# Patient Record
Sex: Female | Born: 1950 | Race: White | Hispanic: No | Marital: Married | Smoking: Never smoker
Health system: Southern US, Community
[De-identification: ages and names within clinical notes are randomized; demographics above are authoritative.]

## PROBLEM LIST (undated history)

## (undated) DIAGNOSIS — F419 Anxiety disorder, unspecified: Secondary | ICD-10-CM

## (undated) DIAGNOSIS — G4733 Obstructive sleep apnea (adult) (pediatric): Secondary | ICD-10-CM

## (undated) DIAGNOSIS — K219 Gastro-esophageal reflux disease without esophagitis: Secondary | ICD-10-CM

## (undated) DIAGNOSIS — I499 Cardiac arrhythmia, unspecified: Secondary | ICD-10-CM

## (undated) DIAGNOSIS — T300 Burn of unspecified body region, unspecified degree: Secondary | ICD-10-CM

## (undated) HISTORY — DX: Burn of unspecified body region, unspecified degree: T30.0

## (undated) HISTORY — DX: Obstructive sleep apnea (adult) (pediatric): G47.33

## (undated) HISTORY — PX: TUBAL LIGATION: SHX77

## (undated) HISTORY — PX: CYSTOSCOPY: SUR368

## (undated) HISTORY — DX: Anxiety disorder, unspecified: F41.9

## (undated) HISTORY — PX: COLONOSCOPY: SHX174

---

## 1988-09-21 DIAGNOSIS — T300 Burn of unspecified body region, unspecified degree: Secondary | ICD-10-CM

## 1988-09-21 HISTORY — DX: Burn of unspecified body region, unspecified degree: T30.0

## 1999-11-17 ENCOUNTER — Other Ambulatory Visit: Admission: RE | Admit: 1999-11-17 | Discharge: 1999-11-17 | Payer: Self-pay | Admitting: Gynecology

## 2000-04-13 ENCOUNTER — Encounter: Payer: Self-pay | Admitting: Gynecology

## 2000-04-13 ENCOUNTER — Encounter: Admission: RE | Admit: 2000-04-13 | Discharge: 2000-04-13 | Payer: Self-pay | Admitting: Gynecology

## 2000-10-02 ENCOUNTER — Ambulatory Visit (HOSPITAL_BASED_OUTPATIENT_CLINIC_OR_DEPARTMENT_OTHER): Admission: RE | Admit: 2000-10-02 | Discharge: 2000-10-02 | Payer: Self-pay | Admitting: Internal Medicine

## 2000-11-18 ENCOUNTER — Other Ambulatory Visit: Admission: RE | Admit: 2000-11-18 | Discharge: 2000-11-18 | Payer: Self-pay | Admitting: Gynecology

## 2002-03-02 LAB — HM COLONOSCOPY: HM Colonoscopy: NORMAL

## 2004-12-02 ENCOUNTER — Ambulatory Visit: Payer: Self-pay | Admitting: Internal Medicine

## 2005-04-27 ENCOUNTER — Ambulatory Visit: Payer: Self-pay | Admitting: Internal Medicine

## 2005-09-23 ENCOUNTER — Other Ambulatory Visit: Admission: RE | Admit: 2005-09-23 | Discharge: 2005-09-23 | Payer: Self-pay | Admitting: Gynecology

## 2005-09-24 ENCOUNTER — Ambulatory Visit: Payer: Self-pay | Admitting: Internal Medicine

## 2005-11-19 ENCOUNTER — Ambulatory Visit: Payer: Self-pay | Admitting: Internal Medicine

## 2005-12-07 ENCOUNTER — Ambulatory Visit: Payer: Self-pay | Admitting: Internal Medicine

## 2006-04-27 ENCOUNTER — Ambulatory Visit: Payer: Self-pay | Admitting: Internal Medicine

## 2006-08-26 ENCOUNTER — Ambulatory Visit: Payer: Self-pay | Admitting: Internal Medicine

## 2006-08-31 ENCOUNTER — Ambulatory Visit: Payer: Self-pay | Admitting: Internal Medicine

## 2006-09-04 ENCOUNTER — Ambulatory Visit: Payer: Self-pay | Admitting: Family Medicine

## 2006-10-01 ENCOUNTER — Ambulatory Visit: Payer: Self-pay | Admitting: Internal Medicine

## 2006-10-26 ENCOUNTER — Ambulatory Visit: Payer: Self-pay | Admitting: Internal Medicine

## 2006-11-30 ENCOUNTER — Ambulatory Visit: Payer: Self-pay | Admitting: Internal Medicine

## 2007-01-24 ENCOUNTER — Other Ambulatory Visit: Admission: RE | Admit: 2007-01-24 | Discharge: 2007-01-24 | Payer: Self-pay | Admitting: Internal Medicine

## 2007-01-24 ENCOUNTER — Encounter: Payer: Self-pay | Admitting: Internal Medicine

## 2007-01-24 ENCOUNTER — Ambulatory Visit: Payer: Self-pay | Admitting: Internal Medicine

## 2007-01-24 LAB — CONVERTED CEMR LAB

## 2007-04-15 ENCOUNTER — Encounter: Payer: Self-pay | Admitting: Internal Medicine

## 2007-04-15 DIAGNOSIS — T300 Burn of unspecified body region, unspecified degree: Secondary | ICD-10-CM | POA: Insufficient documentation

## 2007-04-15 DIAGNOSIS — J158 Pneumonia due to other specified bacteria: Secondary | ICD-10-CM | POA: Insufficient documentation

## 2007-04-15 DIAGNOSIS — R0683 Snoring: Secondary | ICD-10-CM | POA: Insufficient documentation

## 2007-07-14 ENCOUNTER — Encounter: Payer: Self-pay | Admitting: Internal Medicine

## 2007-07-14 ENCOUNTER — Ambulatory Visit: Payer: Self-pay | Admitting: Internal Medicine

## 2007-07-14 DIAGNOSIS — F439 Reaction to severe stress, unspecified: Secondary | ICD-10-CM | POA: Insufficient documentation

## 2007-10-25 ENCOUNTER — Encounter: Payer: Self-pay | Admitting: Internal Medicine

## 2007-11-10 ENCOUNTER — Encounter: Payer: Self-pay | Admitting: Internal Medicine

## 2007-12-28 ENCOUNTER — Ambulatory Visit: Payer: Self-pay | Admitting: Internal Medicine

## 2007-12-28 ENCOUNTER — Telehealth: Payer: Self-pay | Admitting: Internal Medicine

## 2008-02-02 ENCOUNTER — Telehealth: Payer: Self-pay | Admitting: Internal Medicine

## 2008-04-09 ENCOUNTER — Ambulatory Visit: Payer: Self-pay | Admitting: Internal Medicine

## 2008-04-09 DIAGNOSIS — IMO0002 Reserved for concepts with insufficient information to code with codable children: Secondary | ICD-10-CM | POA: Insufficient documentation

## 2008-05-01 ENCOUNTER — Ambulatory Visit: Payer: Self-pay | Admitting: Internal Medicine

## 2008-05-01 LAB — CONVERTED CEMR LAB
ALT: 21 units/L (ref 0–35)
AST: 26 units/L (ref 0–37)
Albumin: 3.9 g/dL (ref 3.5–5.2)
Alkaline Phosphatase: 79 units/L (ref 39–117)
BUN: 16 mg/dL (ref 6–23)
Basophils Absolute: 0.1 10*3/uL (ref 0.0–0.1)
Basophils Relative: 0.9 % (ref 0.0–3.0)
Bilirubin Urine: NEGATIVE
Bilirubin, Direct: 0.1 mg/dL (ref 0.0–0.3)
CO2: 30 meq/L (ref 19–32)
Calcium: 9.3 mg/dL (ref 8.4–10.5)
Chloride: 101 meq/L (ref 96–112)
Cholesterol: 158 mg/dL (ref 0–200)
Creatinine, Ser: 0.8 mg/dL (ref 0.4–1.2)
Eosinophils Absolute: 0.3 10*3/uL (ref 0.0–0.7)
Eosinophils Relative: 3 % (ref 0.0–5.0)
GFR calc Af Amer: 95 mL/min
GFR calc non Af Amer: 79 mL/min
Glucose, Bld: 99 mg/dL (ref 70–99)
HCT: 37.6 % (ref 36.0–46.0)
HDL: 44.7 mg/dL (ref >39.0)
Hemoglobin, Urine: NEGATIVE
Hemoglobin: 13.3 g/dL (ref 12.0–15.0)
Ketones, ur: NEGATIVE mg/dL
LDL Cholesterol: 83 mg/dL (ref 0–99)
Leukocytes, UA: NEGATIVE
Lymphocytes Relative: 41.4 % (ref 12.0–46.0)
MCHC: 35.4 g/dL (ref 30.0–36.0)
MCV: 89.6 fL (ref 78.0–100.0)
Monocytes Absolute: 0.5 10*3/uL (ref 0.1–1.0)
Monocytes Relative: 5.4 % (ref 3.0–12.0)
Neutro Abs: 4.1 10*3/uL (ref 1.4–7.7)
Neutrophils Relative %: 49.3 % (ref 43.0–77.0)
Nitrite: NEGATIVE
Platelets: 210 10*3/uL (ref 150–400)
Potassium: 4.8 meq/L (ref 3.5–5.1)
RBC: 4.2 M/uL (ref 3.87–5.11)
RDW: 11.9 % (ref 11.5–14.6)
Sodium: 137 meq/L (ref 135–145)
Specific Gravity, Urine: 1.02 (ref 1.000–1.03)
TSH: 1.49 microintl units/mL (ref 0.35–5.50)
Total Bilirubin: 1.2 mg/dL (ref 0.3–1.2)
Total CHOL/HDL Ratio: 3.5
Total Protein, Urine: NEGATIVE mg/dL
Total Protein: 6.6 g/dL (ref 6.0–8.3)
Triglycerides: 150 mg/dL — ABNORMAL HIGH (ref 0–149)
Urine Glucose: NEGATIVE mg/dL
Urobilinogen, UA: 0.2 (ref 0.0–1.0)
VLDL: 30 mg/dL (ref 0–40)
WBC: 8.5 10*3/uL (ref 4.5–10.5)
pH: 6 (ref 5.0–8.0)

## 2008-05-09 ENCOUNTER — Encounter: Payer: Self-pay | Admitting: Internal Medicine

## 2008-05-09 ENCOUNTER — Ambulatory Visit: Payer: Self-pay | Admitting: Internal Medicine

## 2008-05-09 ENCOUNTER — Other Ambulatory Visit: Admission: RE | Admit: 2008-05-09 | Discharge: 2008-05-09 | Payer: Self-pay | Admitting: Internal Medicine

## 2008-05-09 DIAGNOSIS — N952 Postmenopausal atrophic vaginitis: Secondary | ICD-10-CM | POA: Insufficient documentation

## 2008-05-15 ENCOUNTER — Encounter: Payer: Self-pay | Admitting: Internal Medicine

## 2008-07-05 ENCOUNTER — Ambulatory Visit: Payer: Self-pay | Admitting: Internal Medicine

## 2008-07-05 DIAGNOSIS — J019 Acute sinusitis, unspecified: Secondary | ICD-10-CM | POA: Insufficient documentation

## 2008-07-05 DIAGNOSIS — J3089 Other allergic rhinitis: Secondary | ICD-10-CM

## 2008-07-05 DIAGNOSIS — J302 Other seasonal allergic rhinitis: Secondary | ICD-10-CM | POA: Insufficient documentation

## 2008-11-13 ENCOUNTER — Encounter: Payer: Self-pay | Admitting: Internal Medicine

## 2008-12-03 ENCOUNTER — Ambulatory Visit: Payer: Self-pay | Admitting: Internal Medicine

## 2008-12-03 DIAGNOSIS — H8309 Labyrinthitis, unspecified ear: Secondary | ICD-10-CM | POA: Insufficient documentation

## 2008-12-24 ENCOUNTER — Telehealth: Payer: Self-pay | Admitting: Internal Medicine

## 2009-01-05 ENCOUNTER — Ambulatory Visit: Payer: Self-pay | Admitting: Family Medicine

## 2009-06-10 ENCOUNTER — Ambulatory Visit: Payer: Self-pay | Admitting: Internal Medicine

## 2009-06-10 LAB — CONVERTED CEMR LAB
ALT: 19 units/L (ref 0–35)
AST: 23 units/L (ref 0–37)
Albumin: 3.8 g/dL (ref 3.5–5.2)
Alkaline Phosphatase: 71 units/L (ref 39–117)
BUN: 13 mg/dL (ref 6–23)
Basophils Absolute: 0.1 10*3/uL (ref 0.0–0.1)
Basophils Relative: 0.6 % (ref 0.0–3.0)
Bilirubin Urine: NEGATIVE
Bilirubin, Direct: 0.1 mg/dL (ref 0.0–0.3)
CO2: 29 meq/L (ref 19–32)
Calcium: 8.9 mg/dL (ref 8.4–10.5)
Chloride: 104 meq/L (ref 96–112)
Cholesterol: 174 mg/dL (ref 0–200)
Creatinine, Ser: 0.8 mg/dL (ref 0.4–1.2)
Eosinophils Absolute: 0.3 10*3/uL (ref 0.0–0.7)
Eosinophils Relative: 4 % (ref 0.0–5.0)
GFR calc non Af Amer: 78.49 mL/min (ref >60)
Glucose, Bld: 88 mg/dL (ref 70–99)
HCT: 36.3 % (ref 36.0–46.0)
HDL: 44.7 mg/dL (ref >39.00)
Hemoglobin, Urine: NEGATIVE
Hemoglobin: 12.7 g/dL (ref 12.0–15.0)
Ketones, ur: NEGATIVE mg/dL
LDL Cholesterol: 104 mg/dL — ABNORMAL HIGH (ref 0–99)
Leukocytes, UA: NEGATIVE
Lymphocytes Relative: 44 % (ref 12.0–46.0)
Lymphs Abs: 3.7 10*3/uL (ref 0.7–4.0)
MCHC: 34.9 g/dL (ref 30.0–36.0)
MCV: 89.3 fL (ref 78.0–100.0)
Monocytes Absolute: 0.5 10*3/uL (ref 0.1–1.0)
Monocytes Relative: 5.7 % (ref 3.0–12.0)
Neutro Abs: 3.9 10*3/uL (ref 1.4–7.7)
Neutrophils Relative %: 45.7 % (ref 43.0–77.0)
Nitrite: NEGATIVE
Platelets: 211 10*3/uL (ref 150.0–400.0)
Potassium: 4.8 meq/L (ref 3.5–5.1)
RBC: 4.07 M/uL (ref 3.87–5.11)
RDW: 12.3 % (ref 11.5–14.6)
Sodium: 139 meq/L (ref 135–145)
Specific Gravity, Urine: 1.015 (ref 1.000–1.030)
TSH: 1.21 microintl units/mL (ref 0.35–5.50)
Total Bilirubin: 1.1 mg/dL (ref 0.3–1.2)
Total CHOL/HDL Ratio: 4
Total Protein, Urine: NEGATIVE mg/dL
Total Protein: 6.6 g/dL (ref 6.0–8.3)
Triglycerides: 128 mg/dL (ref 0.0–149.0)
Urine Glucose: NEGATIVE mg/dL
Urobilinogen, UA: 0.2 (ref 0.0–1.0)
VLDL: 25.6 mg/dL (ref 0.0–40.0)
WBC: 8.5 10*3/uL (ref 4.5–10.5)
pH: 7 (ref 5.0–8.0)

## 2009-06-13 ENCOUNTER — Ambulatory Visit: Payer: Self-pay | Admitting: Internal Medicine

## 2009-06-13 ENCOUNTER — Encounter: Payer: Self-pay | Admitting: Internal Medicine

## 2009-06-13 ENCOUNTER — Other Ambulatory Visit: Admission: RE | Admit: 2009-06-13 | Discharge: 2009-06-13 | Payer: Self-pay | Admitting: Internal Medicine

## 2009-06-19 ENCOUNTER — Encounter: Payer: Self-pay | Admitting: Internal Medicine

## 2009-09-03 ENCOUNTER — Ambulatory Visit: Payer: Self-pay | Admitting: Internal Medicine

## 2009-11-19 ENCOUNTER — Encounter: Payer: Self-pay | Admitting: Internal Medicine

## 2009-11-19 LAB — HM MAMMOGRAPHY: HM Mammogram: NORMAL

## 2009-11-25 ENCOUNTER — Telehealth: Payer: Self-pay | Admitting: Internal Medicine

## 2009-12-30 ENCOUNTER — Ambulatory Visit: Payer: Self-pay | Admitting: Internal Medicine

## 2010-07-22 ENCOUNTER — Ambulatory Visit: Payer: Self-pay | Admitting: Internal Medicine

## 2010-07-22 LAB — CONVERTED CEMR LAB
ALT: 31 units/L (ref 0–35)
AST: 37 units/L (ref 0–37)
Albumin: 3.9 g/dL (ref 3.5–5.2)
Alkaline Phosphatase: 90 units/L (ref 39–117)
BUN: 16 mg/dL (ref 6–23)
Basophils Absolute: 0 10*3/uL (ref 0.0–0.1)
Basophils Relative: 0.6 % (ref 0.0–3.0)
Bilirubin Urine: NEGATIVE
Bilirubin, Direct: 0.2 mg/dL (ref 0.0–0.3)
CO2: 32 meq/L (ref 19–32)
Calcium: 9.7 mg/dL (ref 8.4–10.5)
Chloride: 98 meq/L (ref 96–112)
Cholesterol: 205 mg/dL — ABNORMAL HIGH (ref 0–200)
Creatinine, Ser: 0.7 mg/dL (ref 0.4–1.2)
Direct LDL: 120.1 mg/dL
Eosinophils Absolute: 0.3 10*3/uL (ref 0.0–0.7)
Eosinophils Relative: 4.3 % (ref 0.0–5.0)
GFR calc non Af Amer: 88.29 mL/min (ref >60)
Glucose, Bld: 78 mg/dL (ref 70–99)
HCT: 41.5 % (ref 36.0–46.0)
HDL: 53.8 mg/dL (ref >39.00)
Hemoglobin, Urine: NEGATIVE
Hemoglobin: 14.1 g/dL (ref 12.0–15.0)
Ketones, ur: NEGATIVE mg/dL
Leukocytes, UA: NEGATIVE
Lymphocytes Relative: 42.1 % (ref 12.0–46.0)
Lymphs Abs: 3.3 10*3/uL (ref 0.7–4.0)
MCHC: 34.1 g/dL (ref 30.0–36.0)
MCV: 91.7 fL (ref 78.0–100.0)
Monocytes Absolute: 0.5 10*3/uL (ref 0.1–1.0)
Monocytes Relative: 5.9 % (ref 3.0–12.0)
Neutro Abs: 3.7 10*3/uL (ref 1.4–7.7)
Neutrophils Relative %: 47.1 % (ref 43.0–77.0)
Nitrite: NEGATIVE
Platelets: 240 10*3/uL (ref 150.0–400.0)
Potassium: 4.9 meq/L (ref 3.5–5.1)
RBC: 4.53 M/uL (ref 3.87–5.11)
RDW: 12.8 % (ref 11.5–14.6)
Sodium: 137 meq/L (ref 135–145)
Specific Gravity, Urine: <=1.005 (ref 1.000–1.030)
TSH: 2.68 microintl units/mL (ref 0.35–5.50)
Total Bilirubin: 1.1 mg/dL (ref 0.3–1.2)
Total CHOL/HDL Ratio: 4
Total Protein, Urine: NEGATIVE mg/dL
Total Protein: 7.1 g/dL (ref 6.0–8.3)
Triglycerides: 106 mg/dL (ref 0.0–149.0)
Urine Glucose: NEGATIVE mg/dL
Urobilinogen, UA: 0.2 (ref 0.0–1.0)
VLDL: 21.2 mg/dL (ref 0.0–40.0)
WBC: 7.9 10*3/uL (ref 4.5–10.5)
pH: 7 (ref 5.0–8.0)

## 2010-07-29 ENCOUNTER — Ambulatory Visit: Payer: Self-pay | Admitting: Internal Medicine
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
## 2010-10-21 NOTE — Progress Notes (Signed)
        Preventive Care Screening  Mammogram:    Date:  11/19/2009    Results:  normal

## 2010-11-19 ENCOUNTER — Telehealth: Payer: Self-pay | Admitting: Internal Medicine

## 2010-11-25 ENCOUNTER — Encounter: Payer: Self-pay | Admitting: Internal Medicine

## 2010-11-27 NOTE — Progress Notes (Signed)
Summary: pt requests azithromycin liquid be called in  Phone Note Call from Patient Call back at Work Phone [phone] Call back at  or pm call [phone]   Caller: Patient Call For: Dr Debby Bud Summary of Call: PT has headache, face pain, around sinus area. Pt last ov Nov 2011. We have no openings in the clinic today, can she get antibiotic, [facility]. Initial call taken by: Verdell Face,  November 19, 2010 8:10 AM  Follow-up for Phone Call        ok for azithromycin 200/ml, 2.5 ml once daily x 3 days, 10cc Follow-up by: Jacques Navy MD,  November 19, 2010 1:25 PM  Additional Follow-up for Phone Call Additional follow up Details #1::        Patient notified.Alvy Beal Archie CMA  November 19, 2010 4:22 PM     New/Updated Medications: AZITHROMYCIN 200 MG/5ML SUSR (AZITHROMYCIN) 2.18ml daily x 3days Prescriptions: AZITHROMYCIN 200 MG/5ML SUSR (AZITHROMYCIN) 2.7ml daily x 3days  #10cc x 0   Entered by:   Rock Nephew CMA   Authorized by:   Jacques Navy MD   Signed by:   Rock Nephew CMA on 11/19/2010   Method used:   Faxed to ...       [facility]  [address] # [id number]* (retail)       [address]       [place], [place]  [address]       Ph: [phone] or [phone]       Fax: [phone]   RxID:   [id number]

## 2011-01-27 ENCOUNTER — Other Ambulatory Visit: Payer: Self-pay | Admitting: Internal Medicine

## 2011-01-28 ENCOUNTER — Telehealth: Payer: Self-pay | Admitting: *Deleted

## 2011-01-28 NOTE — Telephone Encounter (Signed)
Ok for refill x 5 

## 2011-01-28 NOTE — Telephone Encounter (Signed)
Please Advise refill on Alprazolam 0.5 mg.

## 2011-01-29 ENCOUNTER — Ambulatory Visit (INDEPENDENT_AMBULATORY_CARE_PROVIDER_SITE_OTHER): Payer: BC Managed Care – PPO | Admitting: Internal Medicine

## 2011-01-29 VITALS — BP 100/62 | HR 78 | Temp 97.8°F | Wt 129.0 lb

## 2011-01-29 DIAGNOSIS — F411 Generalized anxiety disorder: Secondary | ICD-10-CM

## 2011-01-29 MED ORDER — ALPRAZOLAM 0.5 MG PO TABS: 0.2500 mg | ORAL_TABLET | Freq: Three times a day (TID) | ORAL | Status: DC | PRN

## 2011-01-30 ENCOUNTER — Telehealth: Payer: Self-pay | Admitting: *Deleted

## 2011-01-30 MED ORDER — ALPRAZOLAM 0.5 MG PO TABS: 0.2500 mg | ORAL_TABLET | Freq: Three times a day (TID) | ORAL | Status: DC | PRN

## 2011-01-30 NOTE — Telephone Encounter (Signed)
Recived another mess- Alprazolam was called into [facility] (which she had to pay out of pocket for b/c ins did not cover at that pharm) Per pt, they gave her the "round" pills which cause her to be "sick". Need to call pharm for more details. Is it ok to provide new rx for alprazolam to pt? She fill rx yesterday but says she can not take these.

## 2011-01-30 NOTE — Telephone Encounter (Signed)
Pt aware that Rx was called in. She says she needs oval NOT round manuf - confirmed w/pharm, they have oval tabs.

## 2011-01-30 NOTE — Telephone Encounter (Signed)
Pt left mess w/front desk, req a call back.

## 2011-01-30 NOTE — Telephone Encounter (Signed)
She needs brand name and it is ok for another prescription.

## 2011-02-01 NOTE — Progress Notes (Signed)
  Subjective:    Patient ID: Amber Shelton, female    DOB: 04-17-1951, 60 y.o.   MRN: [id number]  HPI Amber Shelton presents for routine medical follow-up and attention to her chronic anxiety. In the interval since her last visit she has been doing well with no major medical problems. 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 She has the usual stresses of work in addition to the responsibilty for her parents. She has no specific complaints.   PMH, FamHx and SocHx reviewed for any changes and relevance.    Review of Systems Review of Systems  Constitutional:  Negative for fever, chills, activity change and unexpected weight change.  HENT:  Negative for hearing loss, ear pain, congestion, neck stiffness and postnasal drip.   Eyes: Negative for pain, discharge and visual disturbance.  Respiratory: Negative for chest tightness and wheezing.   Cardiovascular: Negative for chest pain and palpitations.       [No decreased exercise tolerance Gastrointestinal: [No change in bowel habit. No bloating or gas. No reflux or indigestion Genitourinary: Negative for urgency, frequency, flank pain and difficulty urinating.  Musculoskeletal: Negative for myalgias, back pain, arthralgias and gait problem.  Neurological: Negative for dizziness, tremors, weakness and headaches.  Hematological: Negative for adenopathy.  Psychiatric/Behavioral: Negative for behavioral problems and dysphoric mood.       Objective:   Physical Exam WNWD white woman in no distress HEENT- C&S clear, oropharynx without lesions. Chest - good breath sounds Cor - RRR Abdomen - soft.       Assessment & Plan:  1. Chrnic anxiety - she is doing well and remains controlled on her current medical regimen.  Plan- renewed Rx x 6 months.
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
## 2011-02-06 NOTE — Assessment & Plan Note (Signed)
[facility]                           PRIMARY CARE OFFICE NOTE   Amber, Shelton                       MRN:          [id number]  DATE:08/26/2006                            DOB:          05/14/1951    Amber Shelton is well-known to me and followed on a regular basis.  She was  seen acutely today, without benefit of her chart.   The patient reports she has been fatigued throughout November.  She says  this coincides with when they turned on the heat in their new home.  In  fact, she reports that, since moving into her new home, she has had more  problems with her allergies and congestion than she has ever had.  The  patient reports Tuesday, December 4, she developed a sore throat for 24  hours, which progressed to laryngitis, which has now progressed to  significant sinus congestion with cough, postnasal drainage.  She has  had no purulent drainage.   CURRENT MEDICATIONS:  1. Xanax daily.  2. Claritin 10 mg daily.  3. Vitamins.   REVIEW OF SYSTEMS:  Negative for any cardiovascular symptoms.  She has  had no respiratory difficulty nor wheezing.  No GI complaints.   PHYSICAL EXAMINATION:  VITAL SIGNS:  Temperature 98.8, blood pressure  120/79, pulse 83, weight 130.  GENERAL APPEARANCE:  A well-nourished woman in no acute distress.  HEENT EXAM:  The patient had no tenderness to percussion over her  frontal or maxillary sinuses.  EACs and TMs were normal.  CHEST:  Clear with no rales, wheezes or rhonchi.   IMPRESSION AND PLAN:  Allergic rhinitis, exacerbated by probable heating  duct contaminants.   PLAN:  The patient is to continue with nasal saline wash.  I have  recommended using aromatics and vaporizer treatments.  She was given  samples of Veramyst and she is to continue with fluticasone if this is  beneficial to her.  She will continue with Claritin.     Rosalyn Gess Norins, MD  Electronically Signed    MEN/MedQ  DD: 08/27/2006  DT:  08/27/2006  Job #: [id number]

## 2011-02-06 NOTE — Assessment & Plan Note (Signed)
[facility]                             PULMONARY OFFICE NOTE   KASH, MOTHERSHEAD                       MRN:          [id number]  DATE:10/26/2006                            DOB:          Jun 30, 1951    PULMONARY/ALLERGY FOLLOWUP   PRIMARY PHYSICIAN:  Dr. Illene Regulus.   PROBLEM:  Seasonal rhinitis.   HISTORY:  She returns today as planned for allergy skin testing, saying  that she feels well currently, no recent viral infections.  She never  goes anywhere without chewable Benadryl.   MEDICATIONS:  1. Xanax 1/2 times 0.5 mg b.i.d.  2. Multivitamin and calcium.  3. P.R.N. use of Benadryl as noted but none recently.   ALLERGIES:  DRUG INTOLERANCE TO PROZAC, PAMELOR, BUSPAR, DEMEROL, AND  MORPHINE.   OBJECTIVE:  Weight 131 pounds.  BP 112/78.  Pulse regular 80.  Room air  saturation 99%.  There is no evident nasal congestion or conjunctival edema.  CHEST:  Quiet and clear.  Heart sounds are regular without murmur.   SKIN TESTS:  Positive histamine, negative diluent controls.  Significant  positive puncture and intradermal reactions for grass, weed, and tree  pollens, house dust, and dust mite, feathers, and cat, (they have a  dog).   IMPRESSION:  Skin test results support impression of allergic rhinitis.   PLAN:  Environmental precautions and options were reviewed.  Treatment  tools were explored.  Since she currently feels well and does have  access to Benadryl, I have given her information on environmental  controls and we have discussed treatments including allergy vaccine.  She is going to return in 3 weeks, earlier p.r.n.     Clinton D. Maple Hudson, MD, Tonny Bollman, FACP  Electronically Signed    CDY/MedQ  DD: 10/26/2006  DT: 10/26/2006  Job #: [id number]

## 2011-02-06 NOTE — Assessment & Plan Note (Signed)
[facility]                             PULMONARY OFFICE NOTE   Amber Shelton, Amber Shelton                       MRN:          [id number]  DATE:11/30/2006                            DOB:          1950/10/15    PROBLEM:  Seasonal rhinitis (winter).   HISTORY:  She did well after environmental precautions when she was here  last but then says that after her allergy skin testing she had  persistent palate itching, sinus congestion, and found Claritin was only  lasting 12 hours. This apparently went on for a couple of weeks and just  ended about 2 days ago. She had not recognized sore throat, wheezing or  viral complaints otherwise. Today and yesterday she has felt quite well.   MEDICATIONS:  Claritin, Xanax.   ALLERGIES:  drug intolerant to Prozac, Pamelor, BuSpar, Demerol.   OBJECTIVE:  Weight 129 pounds, blood pressure 116/64, pulse regular 87,  room air saturation 100%.  Conjunctivae are minimally injected. Nasal mucosa is unremarkable.  LUNGS: Clear. Pharynx is clear.   IMPRESSION:  Seasonal rhinitis that has sounded allergic but associated  primarily with the winter season. She expects to clear now as the  weather warms. We have discussed environmental factors related to indoor  dust exposure, possibly molds in the heat system etc, with no particular  clues found. Skin testing was positive in a traditional pattern and that  was reviewed with her.   PLAN:  Try Zyrtec over-the-counter, may compare this to Claritin and may  take the Claritin b.i.d. if she feels she needs to. Since we are getting  into her good season we are going to have her return next December or  January when she expects to start having trouble again, unless needed  sooner.     Amber D. Maple Hudson, MD, Tonny Bollman, FACP  Electronically Signed    CDY/MedQ  DD: 11/30/2006  DT: 12/02/2006  Job #: [id number]
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
## 2011-07-22 ENCOUNTER — Telehealth: Payer: Self-pay | Admitting: *Deleted

## 2011-07-22 MED ORDER — ALPRAZOLAM 0.5 MG PO TABS: 0.2500 mg | ORAL_TABLET | Freq: Three times a day (TID) | ORAL | Status: DC | PRN

## 2011-07-22 NOTE — Telephone Encounter (Signed)
Called in.

## 2011-07-22 NOTE — Telephone Encounter (Signed)
Ok x 5 

## 2011-07-22 NOTE — Telephone Encounter (Signed)
Pt requesting refill on Alprazolam. Pt states she is coming in for a physical in Jan. Please Advise refills

## 2011-07-24 ENCOUNTER — Ambulatory Visit (INDEPENDENT_AMBULATORY_CARE_PROVIDER_SITE_OTHER): Payer: BC Managed Care – PPO | Admitting: Internal Medicine

## 2011-07-24 VITALS — BP 122/70 | HR 98 | Temp 97.9°F | Wt 120.0 lb

## 2011-07-24 DIAGNOSIS — L03011 Cellulitis of right finger: Secondary | ICD-10-CM

## 2011-07-24 DIAGNOSIS — L03019 Cellulitis of unspecified finger: Secondary | ICD-10-CM

## 2011-07-24 NOTE — Progress Notes (Signed)
  Subjective:    Patient ID: Amber Shelton, female    DOB: 1950-10-03, 60 y.o.   MRN: [id number]  HPI Amber Shelton presents for evaluation of inflammation and soreness at nailbase of the right thumb. No precipitating injury or illness. There has been no drainage. She has been applying neosporin.  I have reviewed the patient's medical history in detail and updated the computerized patient record.    Review of Systems System review is negative for any constitutional, cardiac, pulmonary, GI or neuro symptoms or complaints other than as described in the HPI.     Objective:   Physical Exam Vitals noted Cor - regular pulse and rate Pulm - normal respirations Derm- erythema with minimal swelling at the ulnar aspect nailbed right thumb       Assessment & Plan:  Paronychia - early and mild  Plan - soak in warm water with betadiene tid.           For progressive swelling and tenderness return for I&D

## 2011-09-18 ENCOUNTER — Telehealth: Payer: Self-pay | Admitting: *Deleted

## 2011-09-25 ENCOUNTER — Encounter: Payer: BC Managed Care – PPO | Admitting: Internal Medicine

## 2011-10-07 ENCOUNTER — Other Ambulatory Visit (INDEPENDENT_AMBULATORY_CARE_PROVIDER_SITE_OTHER): Payer: BC Managed Care – PPO

## 2011-10-07 ENCOUNTER — Ambulatory Visit (INDEPENDENT_AMBULATORY_CARE_PROVIDER_SITE_OTHER): Payer: BC Managed Care – PPO | Admitting: Internal Medicine

## 2011-10-07 DIAGNOSIS — F411 Generalized anxiety disorder: Secondary | ICD-10-CM

## 2011-10-07 DIAGNOSIS — N952 Postmenopausal atrophic vaginitis: Secondary | ICD-10-CM

## 2011-10-07 DIAGNOSIS — M810 Age-related osteoporosis without current pathological fracture: Secondary | ICD-10-CM

## 2011-10-07 DIAGNOSIS — Z0000 Encounter for general adult medical examination without abnormal findings: Secondary | ICD-10-CM

## 2011-10-07 LAB — LIPID PANEL
Cholesterol: 175 mg/dL (ref 0–200)
VLDL: 17 mg/dL (ref 0.0–40.0)

## 2011-10-07 LAB — COMPREHENSIVE METABOLIC PANEL
Albumin: 4.1 g/dL (ref 3.5–5.2)
BUN: 12 mg/dL (ref 6–23)
CO2: 28 mEq/L (ref 19–32)
Calcium: 9.3 mg/dL (ref 8.4–10.5)
Chloride: 104 mEq/L (ref 96–112)
GFR: 87.93 mL/min (ref >60.00)
Glucose, Bld: 89 mg/dL (ref 70–99)
Potassium: 4.7 mEq/L (ref 3.5–5.1)

## 2011-10-07 LAB — CBC WITH DIFFERENTIAL/PLATELET
Basophils Relative: 0.8 % (ref 0.0–3.0)
Eosinophils Relative: 3.2 % (ref 0.0–5.0)
Lymphocytes Relative: 41.1 % (ref 12.0–46.0)
Neutrophils Relative %: 50.4 % (ref 43.0–77.0)
RBC: 4.21 Mil/uL (ref 3.87–5.11)
WBC: 10.4 10*3/uL (ref 4.5–10.5)

## 2011-10-08 ENCOUNTER — Encounter: Payer: Self-pay | Admitting: Internal Medicine

## 2011-10-08 DIAGNOSIS — Z0000 Encounter for general adult medical examination without abnormal findings: Secondary | ICD-10-CM | POA: Insufficient documentation

## 2011-10-08 LAB — TSH: TSH: 1.52 u[IU]/mL (ref 0.35–5.50)

## 2011-10-08 NOTE — Assessment & Plan Note (Signed)
Interval medical history is unremarkable. Physical exam, sans pelvic, normal. Labs pending. She is current for colorectal cancer screening but will be due for follow-up in the Spring '13. She is due for mammogram in March '13 (USPHCTF rec: q 53yrs). Due for pelvic/PAP in '14. Immunizations: declined influenza vaccine; due for Tdap.  In summary - a very nice woman who has had a personal loss but is coping well. She is medically stable on exam and recommendations will be made as needed on receipt of pending lab work. She will return in 6 months or sooner as needed.

## 2011-10-08 NOTE — Assessment & Plan Note (Signed)
Continues on premarin vaginal cream 2/wk and made no c/o at today' visit.

## 2011-10-08 NOTE — Assessment & Plan Note (Signed)
Very recent loss of her father - unexpected. She is coping well and is provided emotional support to her mother.  Plan - continue present medications.

## 2011-10-08 NOTE — Assessment & Plan Note (Signed)
Due for bone density study - will have scheduled.
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
## 2011-10-11 ENCOUNTER — Encounter: Payer: Self-pay | Admitting: Internal Medicine

## 2011-11-09 ENCOUNTER — Ambulatory Visit (INDEPENDENT_AMBULATORY_CARE_PROVIDER_SITE_OTHER)
Admission: RE | Admit: 2011-11-09 | Discharge: 2011-11-09 | Disposition: A | Discharge status: Home / Self Care | Payer: BC Managed Care – PPO | Source: Ambulatory Visit | Attending: Internal Medicine | Admitting: Internal Medicine

## 2011-11-09 DIAGNOSIS — M810 Age-related osteoporosis without current pathological fracture: Secondary | ICD-10-CM

## 2011-11-11 NOTE — Telephone Encounter (Signed)
Closing phone note 

## 2011-11-13 ENCOUNTER — Telehealth: Payer: Self-pay | Admitting: *Deleted

## 2011-11-13 NOTE — Telephone Encounter (Signed)
Request for letter stating that she is caregiver for mother Virgina Organ) who has dementia needed for [employer] with whom she has appointment on Thursday, 02.28.13

## 2011-11-16 ENCOUNTER — Encounter: Payer: Self-pay | Admitting: Internal Medicine

## 2011-11-16 NOTE — Telephone Encounter (Signed)
LMOM to inform caller that letter was completed by MD and is ready for p/u Mon-Fri 8am-5pm

## 2011-11-17 ENCOUNTER — Encounter: Payer: Self-pay | Admitting: Internal Medicine

## 2011-11-18 ENCOUNTER — Encounter: Payer: Self-pay | Admitting: Internal Medicine

## 2011-12-11 ENCOUNTER — Encounter: Payer: Self-pay | Admitting: Internal Medicine

## 2011-12-25 ENCOUNTER — Encounter: Payer: Self-pay | Admitting: Gastroenterology

## 2012-01-13 ENCOUNTER — Encounter: Payer: Self-pay | Admitting: Gastroenterology

## 2012-02-16 ENCOUNTER — Encounter: Payer: Self-pay | Admitting: Gastroenterology

## 2012-02-16 ENCOUNTER — Ambulatory Visit (AMBULATORY_SURGERY_CENTER): Payer: BC Managed Care – PPO

## 2012-02-16 VITALS — Ht 63.0 in | Wt 125.4 lb

## 2012-02-16 DIAGNOSIS — Z1211 Encounter for screening for malignant neoplasm of colon: Secondary | ICD-10-CM

## 2012-02-16 MED ORDER — PEG-KCL-NACL-NASULF-NA ASC-C 100 G PO SOLR: 1.0000 | Freq: Once | ORAL | Status: AC

## 2012-02-17 ENCOUNTER — Encounter: Payer: Self-pay | Admitting: Internal Medicine

## 2012-02-17 ENCOUNTER — Ambulatory Visit (INDEPENDENT_AMBULATORY_CARE_PROVIDER_SITE_OTHER): Payer: BC Managed Care – PPO | Admitting: Internal Medicine

## 2012-02-17 VITALS — BP 115/70 | HR 85 | Temp 98.2°F | Resp 16 | Wt 126.0 lb

## 2012-02-17 DIAGNOSIS — J069 Acute upper respiratory infection, unspecified: Secondary | ICD-10-CM

## 2012-02-17 DIAGNOSIS — F411 Generalized anxiety disorder: Secondary | ICD-10-CM

## 2012-02-17 MED ORDER — ALPRAZOLAM 0.5 MG PO TABS: 0.2500 mg | ORAL_TABLET | Freq: Three times a day (TID) | ORAL | Status: DC | PRN
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
## 2012-02-19 NOTE — Assessment & Plan Note (Signed)
Doing well despite the death of her father and her long time companion pet.  Plan- renewed anxiolytics for 6 months.

## 2012-02-22 ENCOUNTER — Telehealth: Payer: Self-pay | Admitting: Internal Medicine

## 2012-02-22 MED ORDER — AMOXICILLIN-POT CLAVULANATE 125-31.25 MG/5ML PO SUSR: 7.0000 mL | Freq: Two times a day (BID) | ORAL | Status: DC

## 2012-02-22 NOTE — Telephone Encounter (Signed)
Caller: Erin at [facility]; PCP: Illene Regulus; CB#: [phone]; Call regarding Augmentin liquid that was ordered for the pt this afternoon. RN accessed EPIC, MD ordered, Augmentin 125/31.25 per ml take 7 ml twice a day for 10 days = 140 ml. Pharmacy does not have drug in this strength and this Brand name will cost her >35 dollars. Caller asking if she can substitute Augmentin 250/61ml with reduced dose of 3.5 ml bid. Caller advised ok to do so per Practice Profile. This RN spoke with this caller at initial contact today, 6/3 at 11:42.

## 2012-02-22 NOTE — Telephone Encounter (Signed)
Notified pt liquid augmentin sent to pharmacy... 02/22/12@4 :53pm/LMB

## 2012-02-22 NOTE — Telephone Encounter (Signed)
Caller: Grettel/Patient; PCP: Illene Regulus; CB#: [phone]. Call regarding Question for Md re Sore, Swollen Nose. Caller reports she was seen in the office on Wed 5/29 for her yearly exam. She was not feeling well, with sinus pain and sore throat. MD did not see any sxs of infection and she was given home care. Caller reports she still has sore throat, and her nose is very swollen beginning Sunday 6/2. Nose is  painful with throbbing pain. Green mucous from nose. Afebrile. Caller has used otc meds as MD advised. Per Face Pain or Swelling Protocol, Caller advised she should be seen in the next 24 hrs. Caller requires afternoon appts due to her work schedule. No openings with PCP in the next 2 days. Caller advised a note will be sent for a callback re an appt. PLEASE ADVISE CALLER RE APPT/ANTIBXS at [phone].

## 2012-02-22 NOTE — Telephone Encounter (Signed)
1. OK for augmentin 875 mg bid x 7 #14 2. OV if needed tomorrow.

## 2012-02-22 NOTE — Telephone Encounter (Signed)
Notified pt with md recommendations. Pt is wanting this in a liquid form she states she can't swallow pills...02/22/12@2 :26pm/LMB

## 2012-02-22 NOTE — Telephone Encounter (Signed)
Ok: augmentin 125/31.25 per ml take 7 ml twice a day for 10 days = 140 ml

## 2012-02-23 NOTE — Telephone Encounter (Signed)
Notified pt pharmacy spoke with Olegario Messier already made the substitution and pt pick med up yesterday... 02/23/12@10 :39am/LMB

## 2012-02-23 NOTE — Telephone Encounter (Signed)
That substitution is clinically equivalent and OK to do. Do I need to call pharmacy???

## 2012-03-04 ENCOUNTER — Ambulatory Visit (AMBULATORY_SURGERY_CENTER): Payer: BC Managed Care – PPO | Admitting: Gastroenterology

## 2012-03-04 ENCOUNTER — Encounter: Payer: Self-pay | Admitting: Gastroenterology

## 2012-03-04 VITALS — BP 157/93 | HR 103 | Temp 97.7°F | Resp 23 | Ht 63.0 in | Wt 125.0 lb

## 2012-03-04 DIAGNOSIS — K5730 Diverticulosis of large intestine without perforation or abscess without bleeding: Secondary | ICD-10-CM

## 2012-03-04 DIAGNOSIS — Z1211 Encounter for screening for malignant neoplasm of colon: Secondary | ICD-10-CM

## 2012-03-04 MED ORDER — SODIUM CHLORIDE 0.9 % IV SOLN: 500.0000 mL | INTRAVENOUS | Status: DC

## 2012-03-04 NOTE — Op Note (Signed)
 [facility] [address] [place], [place]  [address]  COLONOSCOPY PROCEDURE REPORT  PATIENT:  Amber Shelton, Amber Shelton  MR#:  [id number] BIRTHDATE:  1951-09-15, 61 yrs. old  GENDER:  female ENDOSCOPIST:  Barbette Hair. Arlyce Dice, MD REF. BY: PROCEDURE DATE:  03/04/2012 PROCEDURE:  Diagnostic Colonoscopy ASA CLASS:  Class II INDICATIONS:  Routine Risk Screening MEDICATIONS:   MAC sedation, administered by CRNA propofol 270mg IV  DESCRIPTION OF PROCEDURE:   After the risks benefits and alternatives of the procedure were thoroughly explained, informed consent was obtained.  Digital rectal exam was performed and revealed no abnormalities.   The LB [id number] endoscope was introduced through the anus and advanced to the cecum, which was identified by both the appendix and ileocecal valve, without limitations.  The quality of the prep was excellent, using MoviPrep.  The instrument was then slowly withdrawn as the colon was fully examined. <<PROCEDUREIMAGES>>  FINDINGS:  Mild diverticulosis was found in the sigmoid colon (see image4).  This was otherwise a normal examination of the colon (see image1, image2, and image5).   Retroflexed views in the rectum revealed no abnormalities.    The time to cecum =  1) 3.50 minutes. The scope was then withdrawn in  1) 6.50  minutes from the cecum and the procedure completed. COMPLICATIONS:  None ENDOSCOPIC IMPRESSION: 1) Mild diverticulosis in the sigmoid colon 2) Otherwise normal examination RECOMMENDATIONS: 1) Continue current colorectal screening recommendations for "routine risk" patients with a repeat colonoscopy in 10 years. REPEAT EXAM:  In 10 year(s) for Colonoscopy.  ______________________________ Barbette Hair. Arlyce Dice, MD  CC:  Jacques Navy, MD  n. Rosalie DoctorBarbette Hair. Zailee Vallely at 03/04/2012 12:01 PM  Denita Lung, [id number]

## 2012-03-04 NOTE — Patient Instructions (Signed)
Mild diverticulosis, otherwise normal exam  Recall colonoscopy in 10 years   YOU HAD AN ENDOSCOPIC PROCEDURE TODAY AT THE [facility]: Refer to the procedure report that was given to you for any specific questions about what was found during the examination.  If the procedure report does not answer your questions, please call your gastroenterologist to clarify.  If you requested that your care partner not be given the details of your procedure findings, then the procedure report has been included in a sealed envelope for you to review at your convenience later.  YOU SHOULD EXPECT: Some feelings of bloating in the abdomen. Passage of more gas than usual.  Walking can help get rid of the air that was put into your GI tract during the procedure and reduce the bloating. If you had a lower endoscopy (such as a colonoscopy or flexible sigmoidoscopy) you may notice spotting of blood in your stool or on the toilet paper. If you underwent a bowel prep for your procedure, then you may not have a normal bowel movement for a few days.  DIET: Your first meal following the procedure should be a light meal and then it is ok to progress to your normal diet.  A half-sandwich or bowl of soup is an example of a good first meal.  Heavy or fried foods are harder to digest and may make you feel nauseous or bloated.  Likewise meals heavy in dairy and vegetables can cause extra gas to form and this can also increase the bloating.  Drink plenty of fluids but you should avoid alcoholic beverages for 24 hours.  ACTIVITY: Your care partner should take you home directly after the procedure.  You should plan to take it easy, moving slowly for the rest of the day.  You can resume normal activity the day after the procedure however you should NOT DRIVE or use heavy machinery for 24 hours (because of the sedation medicines used during the test).    SYMPTOMS TO REPORT IMMEDIATELY: A gastroenterologist can be reached at any  hour.  During normal business hours, 8:30 AM to 5:00 PM Monday through Friday, call [phone].  After hours and on weekends, please call the GI answering service at [phone] who will take a message and have the physician on call contact you.   Following lower endoscopy (colonoscopy or flexible sigmoidoscopy):  Excessive amounts of blood in the stool  Significant tenderness or worsening of abdominal pains  Swelling of the abdomen that is new, acute  Fever of 100F or higher  FOLLOW UP: If any biopsies were taken you will be contacted by phone or by letter within the next 1-3 weeks.  Call your gastroenterologist if you have not heard about the biopsies in 3 weeks.  Our staff will call the home number listed on your records the next business day following your procedure to check on you and address any questions or concerns that you may have at that time regarding the information given to you following your procedure. This is a courtesy call and so if there is no answer at the home number and we have not heard from you through the emergency physician on call, we will assume that you have returned to your regular daily activities without incident.  SIGNATURES/CONFIDENTIALITY: You and/or your care partner have signed paperwork which will be entered into your electronic medical record.  These signatures attest to the fact that that the information above on your After Visit Summary has  been reviewed and is understood.  Full responsibility of the confidentiality of this discharge information lies with you and/or your care-partner.  
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
## 2012-03-07 ENCOUNTER — Telehealth: Payer: Self-pay

## 2012-03-07 NOTE — Telephone Encounter (Signed)
  Follow up Call-  Call back number 03/04/2012  Post procedure Call Back phone  # [phone]  Permission to leave phone message No     Patient questions:  Do you have a fever, pain , or abdominal swelling? no Pain Score  0 *  Have you tolerated food without any problems? yes  Have you been able to return to your normal activities? yes  Do you have any questions about your discharge instructions: Diet   no Medications  no Follow up visit  no  Do you have questions or concerns about your Care? no  Actions: * If pain score is 4 or above: No action needed, pain <4.

## 2012-06-13 ENCOUNTER — Encounter: Payer: Self-pay | Admitting: Internal Medicine

## 2012-06-13 ENCOUNTER — Ambulatory Visit (INDEPENDENT_AMBULATORY_CARE_PROVIDER_SITE_OTHER): Payer: BC Managed Care – PPO | Admitting: Internal Medicine

## 2012-06-13 VITALS — BP 120/80 | HR 78 | Temp 97.8°F | Resp 16 | Wt 130.0 lb

## 2012-06-13 DIAGNOSIS — F411 Generalized anxiety disorder: Secondary | ICD-10-CM

## 2012-06-13 DIAGNOSIS — L609 Nail disorder, unspecified: Secondary | ICD-10-CM

## 2012-06-13 NOTE — Patient Instructions (Addendum)
Nail infection - looks fungal. May need to wait for the nail to come off. Would not recommend oral lamisil at this point due to the risk of intolerance.  Multiple symptoms - sleep change, bowel change, low energy, increased panic attacks. I am concerned that this is buried unresolved grief and loss that needs attention. There is little evidence to suggest this is a mechanical bowel issue or related to an underlying medical condition.   Will refill Xanax without an appointment and you should make your annual exam appointment for January '14.

## 2012-06-13 NOTE — Progress Notes (Signed)
  Subjective:    Patient ID: Amber Shelton, female    DOB: 01/03/1951, 61 y.o.   MRN: [id number]  HPI Amber Shelton prresents with nail infection 3rd digit right hand-at the nail bed. Has tried mupirocin cream, soaks, nothing is making it better.  She has overall change: bowel habit is deranged, sleep pattern is altered - cannot sleep well or as long as she used to, low energy. She dates this to her colonoscopy. Admits to tearfulness, stress and recounts several major losses: death of her father, her uncle, her dog. Her mother has advanced dementia and she feels she has lost her mother.  PMH, FamHx and SocHx reviewed for any changes and relevance.  Current Outpatient Prescriptions on File Prior to Visit  Medication Sig Dispense Refill  . ALPRAZolam (XANAX) 0.5 MG tablet Take 0.5 tablets (0.25 mg total) by mouth 3 (three) times daily as needed.  45 tablet  5  . Ascorbic Acid (VITAMIN C) 500 MG CHEW Chew 1 each by mouth daily.        . calcium carbonate (OS-CAL) 600 MG TABS Take 600 mg by mouth 2 (two) times daily with a meal.        . calcium carbonate (TUMS - DOSED IN MG ELEMENTAL CALCIUM) 500 MG chewable tablet Chew 1 tablet by mouth daily.      . cholecalciferol (VITAMIN D) 1000 UNITS tablet Take by mouth. Gummy Vit-d 2000 units-Take 1-2 daily      . diphenhydrAMINE (BENYLIN) 12.5 MG/5ML liquid Take by mouth as needed. As directed.       . multivitamin (POLY-VITAMINS) CHEW Chew 1 tablet by mouth daily.                                Review of Systems System review is negative for any constitutional, cardiac, pulmonary, GI or neuro symptoms or complaints other than as described in the HPI.     Objective:   Physical Exam Filed Vitals:   06/13/12 1527  BP: 120/80  Pulse: 78  Temp: 97.8 F (36.6 C)  Resp: 16   Gen'l- WNWD white woman in no distress Cor- RRR Pulm - normal respirations Derm - base of finger nail thickened, discolored. Not hot, red or tender       Assessment  & Plan:  Fingernail infection - this has the appearance of a fungal infection, not bacterial.  Plan - no antibiotics.  Will not prescribe system lamisil due to patient h/o multiple drug sensitivities. May try topical lamisil  Consider return to Dermatologist for nail avulsion

## 2012-06-14 NOTE — Assessment & Plan Note (Signed)
Acute exacerbation of anxiety symptoms along with vegative signs of depression: change in sleep, energy, increased panic, tearfulness, anhedonia. Strongly suspect unresolved grief over many recent losses.  Plan  Strongly recommended that she revisit Dr. Dellia Cloud for short term grief counseling.  Continue present dose of Xanax

## 2012-08-27 ENCOUNTER — Other Ambulatory Visit: Payer: Self-pay | Admitting: Internal Medicine

## 2012-08-29 ENCOUNTER — Other Ambulatory Visit: Payer: Self-pay | Admitting: *Deleted

## 2012-08-29 ENCOUNTER — Other Ambulatory Visit: Payer: Self-pay | Admitting: Internal Medicine

## 2012-08-29 MED ORDER — ALPRAZOLAM 0.5 MG PO TABS: 0.2500 mg | ORAL_TABLET | Freq: Three times a day (TID) | ORAL | Status: DC | PRN

## 2012-10-01 ENCOUNTER — Emergency Department (HOSPITAL_COMMUNITY): Payer: BC Managed Care – PPO

## 2012-10-01 ENCOUNTER — Emergency Department (HOSPITAL_COMMUNITY)
Admission: EM | Admit: 2012-10-01 | Discharge: 2012-10-02 | Disposition: A | Discharge status: Home / Self Care | Payer: BC Managed Care – PPO | Attending: Emergency Medicine | Admitting: Emergency Medicine

## 2012-10-01 DIAGNOSIS — M810 Age-related osteoporosis without current pathological fracture: Secondary | ICD-10-CM | POA: Insufficient documentation

## 2012-10-01 DIAGNOSIS — G4733 Obstructive sleep apnea (adult) (pediatric): Secondary | ICD-10-CM | POA: Insufficient documentation

## 2012-10-01 DIAGNOSIS — R079 Chest pain, unspecified: Secondary | ICD-10-CM

## 2012-10-01 DIAGNOSIS — Z87828 Personal history of other (healed) physical injury and trauma: Secondary | ICD-10-CM | POA: Insufficient documentation

## 2012-10-01 DIAGNOSIS — Z8701 Personal history of pneumonia (recurrent): Secondary | ICD-10-CM | POA: Insufficient documentation

## 2012-10-01 DIAGNOSIS — Z79899 Other long term (current) drug therapy: Secondary | ICD-10-CM | POA: Insufficient documentation

## 2012-10-01 DIAGNOSIS — F411 Generalized anxiety disorder: Secondary | ICD-10-CM | POA: Insufficient documentation

## 2012-10-01 DIAGNOSIS — Z8719 Personal history of other diseases of the digestive system: Secondary | ICD-10-CM | POA: Insufficient documentation

## 2012-10-01 DIAGNOSIS — R0789 Other chest pain: Secondary | ICD-10-CM | POA: Insufficient documentation

## 2012-10-01 LAB — CBC
Hemoglobin: 13.2 g/dL (ref 12.0–15.0)
MCH: 30.6 pg (ref 26.0–34.0)
MCV: 89.4 fL (ref 78.0–100.0)
RBC: 4.32 MIL/uL (ref 3.87–5.11)

## 2012-10-01 LAB — TROPONIN I
Troponin I: <0.3 ng/mL (ref <0.30)
Troponin I: <0.3 ng/mL (ref <0.30)

## 2012-10-01 LAB — BASIC METABOLIC PANEL
CO2: 25 mEq/L (ref 19–32)
Calcium: 9.2 mg/dL (ref 8.4–10.5)
Glucose, Bld: 111 mg/dL — ABNORMAL HIGH (ref 70–99)
Sodium: 139 mEq/L (ref 135–145)

## 2012-10-01 MED ORDER — POTASSIUM CHLORIDE CRYS ER 20 MEQ PO TBCR
40.0000 meq | EXTENDED_RELEASE_TABLET | Freq: Once | ORAL | Status: AC
Start: 2012-10-01 — End: 2012-10-01
  Administered 2012-10-01: 40 meq via ORAL
  Filled 2012-10-01: qty 2

## 2012-10-01 MED ORDER — ALPRAZOLAM 0.5 MG PO TABS
0.5000 mg | ORAL_TABLET | Freq: Once | ORAL | Status: DC
  Filled 2012-10-01: qty 1

## 2012-10-01 NOTE — ED Notes (Signed)
Pt is becoming anxious to leave. States she needs to get home soon. Explained the need for a repeat troponin level to rule out cardiac involvement. She agreed but reluctantly.

## 2012-10-01 NOTE — ED Notes (Addendum)
Offered pt something to drink. Pt stated she has her own bottle of water that she is drinking from and she did not want anything else. So far patient has drank 1/4 of the water. Patient is tolerating the water well.

## 2012-10-01 NOTE — ED Notes (Signed)
Pt states she was eating dinner and had sudden pain in her rt chest that moved to mid chest. Stated she began having "mucous pour out" of her mouth. Denied coughing or vomiting. Denies n/v/d, chills, cough. Pt in NAD. States that she is in no pain now, but her throat feels tight. Has hx of anxiety and takes xanax 0.25mg  2-3x daily.

## 2012-10-01 NOTE — ED Notes (Signed)
Pt states she was eating dinner and developed mid-sternal chest pain and pain in throat. Pt denies nausea. Pt states "mucous just keeps coming out". Pt denies recent illness.  Pt has hx of anxiety and panic attacks. Pt states the pain came on suddenly on her R side. Pt describes pain as crushing. Pt states pain improves when she walks around. Pt denies smoking. Denies cardiac hx. Pt states she tried to take a "gas tablet" and a Tums and stated "it just kept coming up."
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
## 2012-10-10 ENCOUNTER — Ambulatory Visit (INDEPENDENT_AMBULATORY_CARE_PROVIDER_SITE_OTHER)
Admission: RE | Admit: 2012-10-10 | Discharge: 2012-10-10 | Disposition: A | Discharge status: Home / Self Care | Payer: BC Managed Care – PPO | Source: Ambulatory Visit | Attending: Internal Medicine | Admitting: Internal Medicine

## 2012-10-10 ENCOUNTER — Other Ambulatory Visit (HOSPITAL_COMMUNITY)
Admission: RE | Admit: 2012-10-10 | Discharge: 2012-10-10 | Disposition: A | Discharge status: Home / Self Care | Payer: BC Managed Care – PPO | Source: Ambulatory Visit | Attending: Internal Medicine | Admitting: Internal Medicine

## 2012-10-10 ENCOUNTER — Encounter: Payer: Self-pay | Admitting: Internal Medicine

## 2012-10-10 ENCOUNTER — Ambulatory Visit (INDEPENDENT_AMBULATORY_CARE_PROVIDER_SITE_OTHER): Payer: BC Managed Care – PPO | Admitting: Internal Medicine

## 2012-10-10 ENCOUNTER — Other Ambulatory Visit: Payer: Self-pay | Admitting: Internal Medicine

## 2012-10-10 VITALS — BP 100/64 | HR 86 | Temp 97.4°F | Resp 10 | Ht 63.0 in | Wt 130.1 lb

## 2012-10-10 DIAGNOSIS — M79609 Pain in unspecified limb: Secondary | ICD-10-CM

## 2012-10-10 DIAGNOSIS — M810 Age-related osteoporosis without current pathological fracture: Secondary | ICD-10-CM

## 2012-10-10 DIAGNOSIS — M79672 Pain in left foot: Secondary | ICD-10-CM

## 2012-10-10 DIAGNOSIS — N952 Postmenopausal atrophic vaginitis: Secondary | ICD-10-CM

## 2012-10-10 DIAGNOSIS — F411 Generalized anxiety disorder: Secondary | ICD-10-CM

## 2012-10-10 DIAGNOSIS — J309 Allergic rhinitis, unspecified: Secondary | ICD-10-CM

## 2012-10-10 DIAGNOSIS — Z124 Encounter for screening for malignant neoplasm of cervix: Secondary | ICD-10-CM

## 2012-10-10 DIAGNOSIS — Z01419 Encounter for gynecological examination (general) (routine) without abnormal findings: Secondary | ICD-10-CM | POA: Insufficient documentation

## 2012-10-10 DIAGNOSIS — Z0000 Encounter for general adult medical examination without abnormal findings: Secondary | ICD-10-CM

## 2012-10-10 MED ORDER — ALPRAZOLAM 0.5 MG PO TABS: 0.5000 mg | ORAL_TABLET | Freq: Three times a day (TID) | ORAL | Status: DC | PRN
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
## 2012-10-10 NOTE — Patient Instructions (Addendum)
Thanks for coming in.  At your convenienc come in for nurse visit for tetanus shot and pneumonia vaccine. 6 weeks later - shingles vaccine.  I should probably see your MOM in the next 3-4 months.  Come see me when you need me.

## 2012-10-11 NOTE — Assessment & Plan Note (Signed)
Interval history - notable for episode of chest pain with full ED eval in Jan '14 - negative work-up with diagnosis of probable panic attack. Otherwise, no major illness, injury or surgery. Physical exam is normal. Lab was done on Jan 11th, '14 - reviewed and normal. She is current with colorectal and breast cancer screening. Immunizations - she will return for Pneumonia, tetanus and shingles immunizations.  In summary - a very nice woman who appears to be medically stable. She is encouraged to exercise more. She will return as needed or in 6 months for med refill.

## 2012-10-11 NOTE — Assessment & Plan Note (Addendum)
DEXA Feb '13: T-scores: Spine -3.6, left femur -2.4, right femur -2.5. Taking calcium supplement and vitamin D  Plan Follow-up DEXA Feb '15

## 2012-10-11 NOTE — Assessment & Plan Note (Signed)
Pelvic Jan '14 - minimal change vaginal mucosa  Plan -  OTC lubricants as needed.

## 2012-10-11 NOTE — Assessment & Plan Note (Signed)
Stable with no recent flare of symptoms

## 2012-10-11 NOTE — Assessment & Plan Note (Signed)
Generally stable on present medical regimen. Had a panic attack Jan 11, '14 with normal ED evaluation including lab and EKG  Plan Continue present medication.

## 2012-11-05 ENCOUNTER — Other Ambulatory Visit: Payer: Self-pay

## 2012-12-28 ENCOUNTER — Encounter: Payer: Self-pay | Admitting: Internal Medicine

## 2013-04-03 ENCOUNTER — Ambulatory Visit (INDEPENDENT_AMBULATORY_CARE_PROVIDER_SITE_OTHER): Payer: BC Managed Care – PPO | Admitting: Internal Medicine

## 2013-04-03 ENCOUNTER — Encounter: Payer: Self-pay | Admitting: Internal Medicine

## 2013-04-03 VITALS — BP 140/80 | HR 81 | Temp 97.3°F | Resp 8 | Ht 63.0 in | Wt 135.0 lb

## 2013-04-03 DIAGNOSIS — F411 Generalized anxiety disorder: Secondary | ICD-10-CM

## 2013-04-03 MED ORDER — ALPRAZOLAM 0.5 MG PO TABS: 0.5000 mg | ORAL_TABLET | Freq: Three times a day (TID) | ORAL | Status: DC | PRN

## 2013-04-03 NOTE — Progress Notes (Signed)
  Subjective:    Patient ID: Amber Shelton, female    DOB: 28-May-1951, 61 y.o.   MRN: [id number]  HPI She has seen a practitioner Norvel Richards Ethelene Browns) of EMDR Eye Movement Desensitization and Reprocessing) - a reorientation of emotions via eye movement, a complex psychotherapeutic technique useful for PTSD and other conditions. This has been very effective and her use of xanax has decreased. Her mother-in-law passed away and there has been a lot more travel to [place]   Her mother is in a good mood but her dementia is progressive. She has been getting care from [facility]  PMH, FamHx and SocHx reviewed for any changes and relevance.  Current Outpatient Prescriptions on File Prior to Visit  Medication Sig Dispense Refill  . Ascorbic Acid (VITAMIN C) 500 MG CHEW Chew 1 each by mouth daily.        . calcium carbonate (OS-CAL) 600 MG TABS Take 600 mg by mouth 2 (two) times daily with a meal.        . calcium carbonate (TUMS - DOSED IN MG ELEMENTAL CALCIUM) 500 MG chewable tablet Chew 1 tablet by mouth once.      . cholecalciferol (VITAMIN D) 1000 UNITS tablet Take by mouth. Gummy Vit-d 2000 units-Take 1-2 daily      . diphenhydrAMINE (BENYLIN) 12.5 MG/5ML liquid Take 12.5 mg by mouth at bedtime as needed. Sleep/ allergic reaction.      . multivitamin (POLY-VITAMINS) CHEW Chew 1 tablet by mouth daily.        Current Facility-Administered Medications on File Prior to Visit  Medication Dose Route Frequency Provider Last Rate Last Dose  . 0.9 %  sodium chloride infusion  500 mL Intravenous Continuous Louis Meckel, MD         Review of Systems System review is negative for any constitutional, cardiac, pulmonary, GI or neuro symptoms or complaints other than as described in the HPI.     Objective:   Physical Exam Filed Vitals:   04/03/13 1416  BP: 140/80  Pulse: 81  Temp: 97.3 F (36.3 C)  Resp: 8   Wt Readings from Last 3 Encounters:  04/03/13 135 lb (61.236 kg)  10/10/12  130 lb 1.3 oz (59.004 kg)  10/01/12 128 lb (58.06 kg)   Gen'l - WNWD woman in no distress Cor- RRR Pulm - normal respirations Neuro - A&O x 3, normal gait and station Psych - calm, no evidence of stress         Assessment & Plan:

## 2013-04-04 NOTE — Assessment & Plan Note (Signed)
She has undergone EMRD therapy and she reports a major improvement leading to a reduction in the use of xanax.  Plan Routine refill of medication.

## 2013-04-29 ENCOUNTER — Other Ambulatory Visit: Payer: Self-pay | Admitting: Internal Medicine

## 2013-05-01 NOTE — Telephone Encounter (Signed)
Alprazolam called to pharmacy  

## 2013-06-10 IMAGING — CR DG CHEST 1V PORT
1 series · 1 of 1 positions shown · non-contrast
Comparison: None.

CLINICAL DATA: 61-year-old female right chest pain.

PORTABLE CHEST - 1 VIEW

[AP]
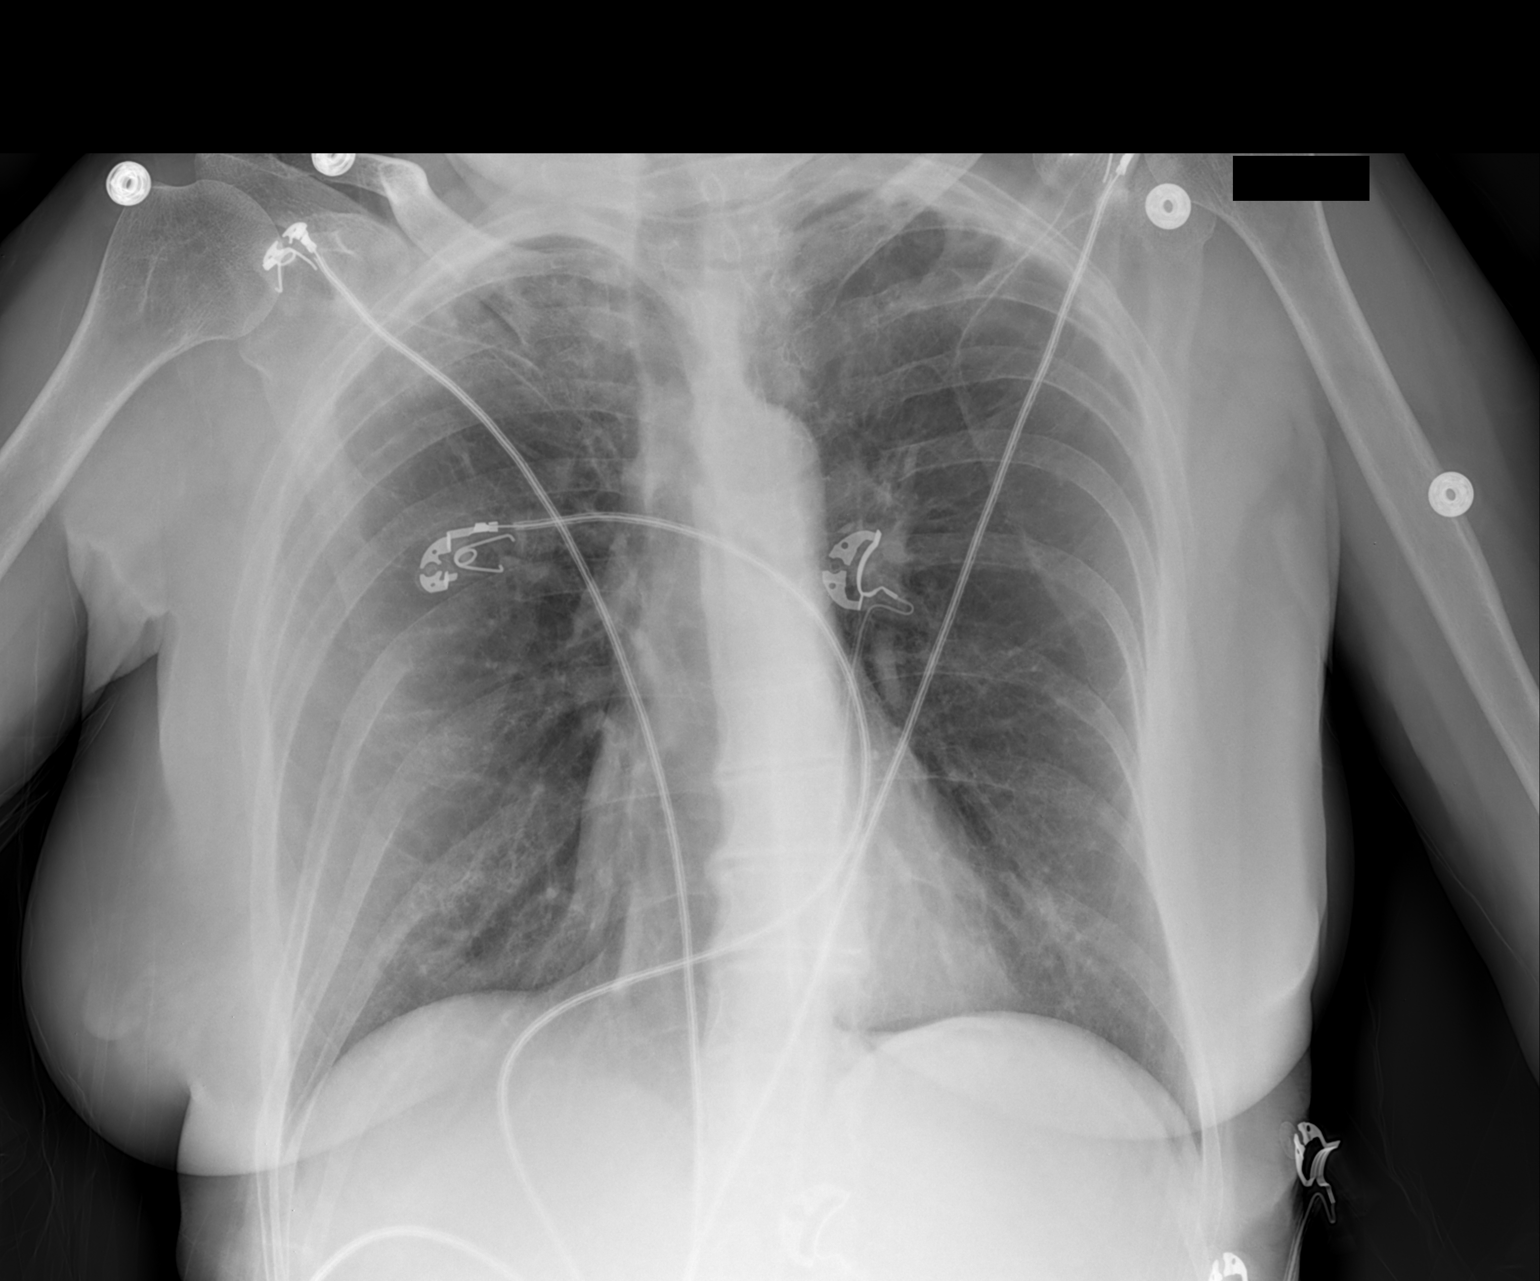

[1 of 1 positions shown; findings below may reference images not displayed]

FINDINGS: Portable upright AP view 9511 hours.  Cardiac size and
mediastinal contours are within normal limits.  Apical scarring.
No pneumothorax or pulmonary edema.  No pleural effusion or
consolidation. No acute osseous abnormality identified.
IMPRESSION: No acute cardiopulmonary abnormality.

## 2013-06-19 IMAGING — CR DG FOOT COMPLETE 3+V*L*
3 series · 3 of 3 positions shown · non-contrast
Comparison: None.

CLINICAL DATA: Left foot pain, deformity.

LEFT FOOT - COMPLETE 3+ VIEW

[view not recorded (1 of 3)]
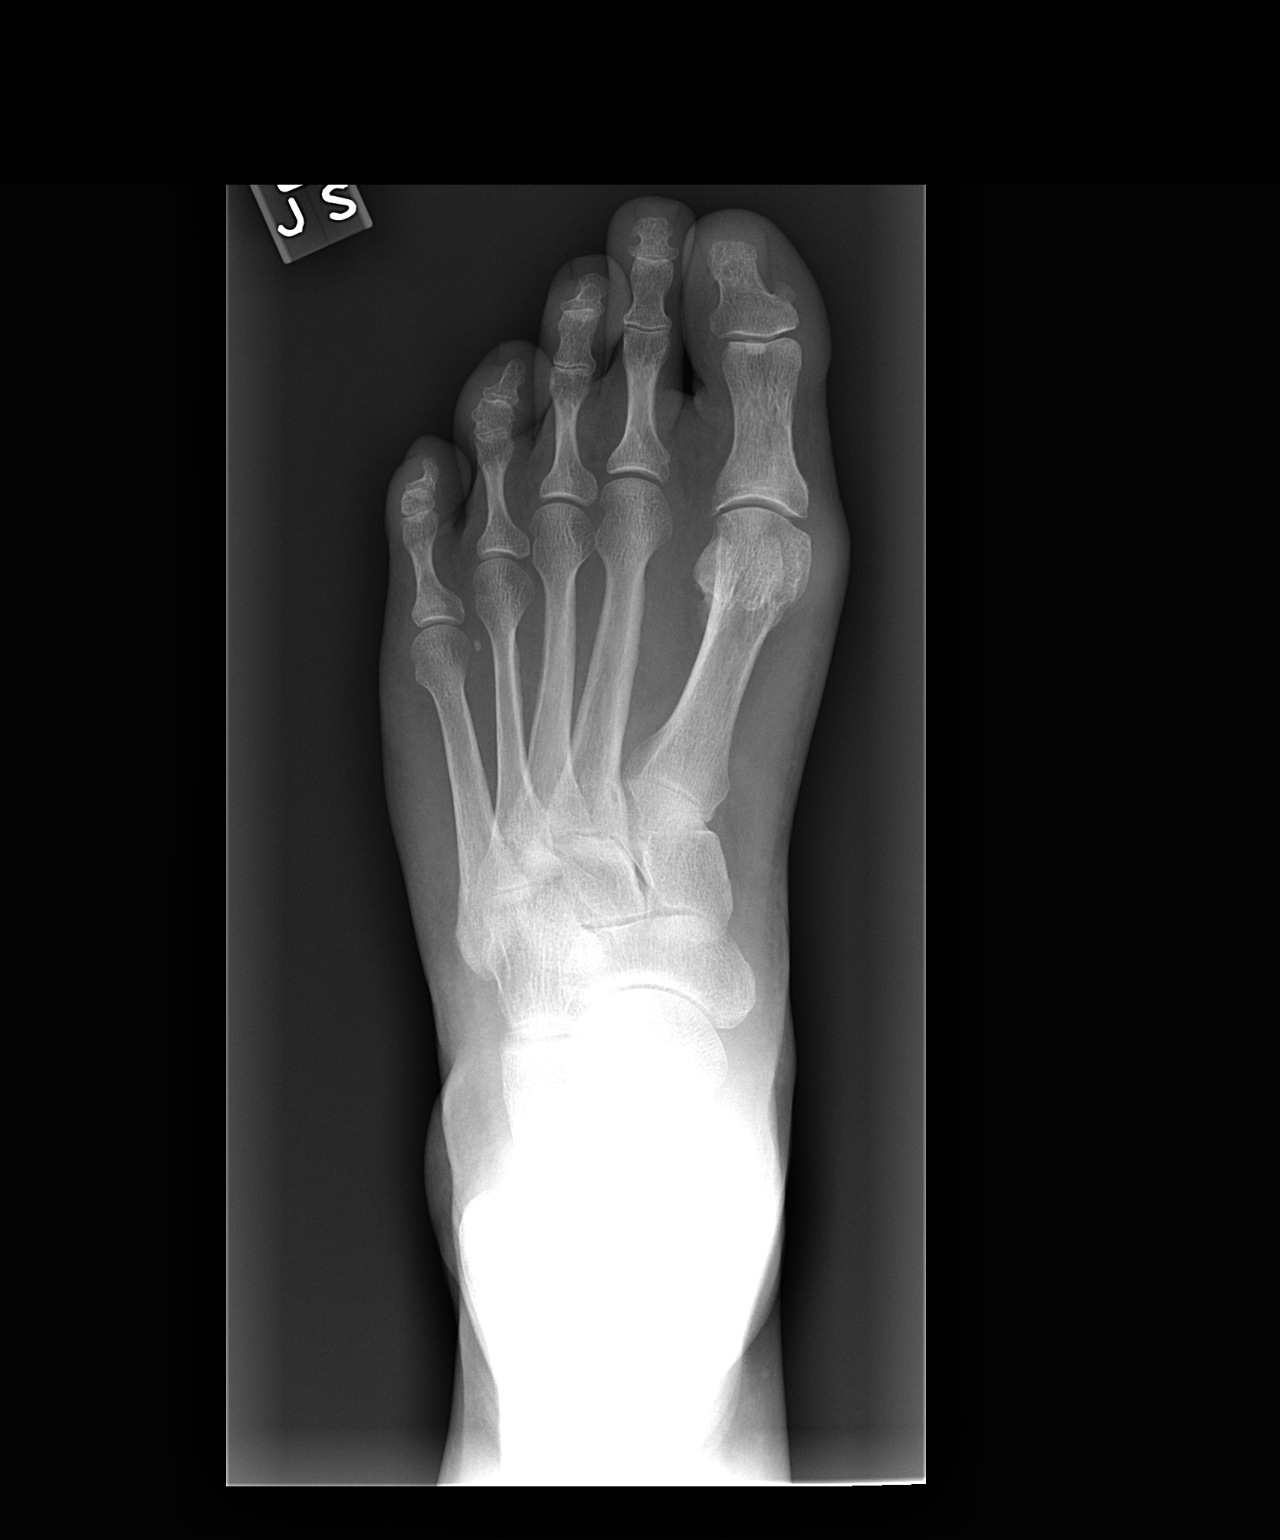

[view not recorded (2 of 3)]
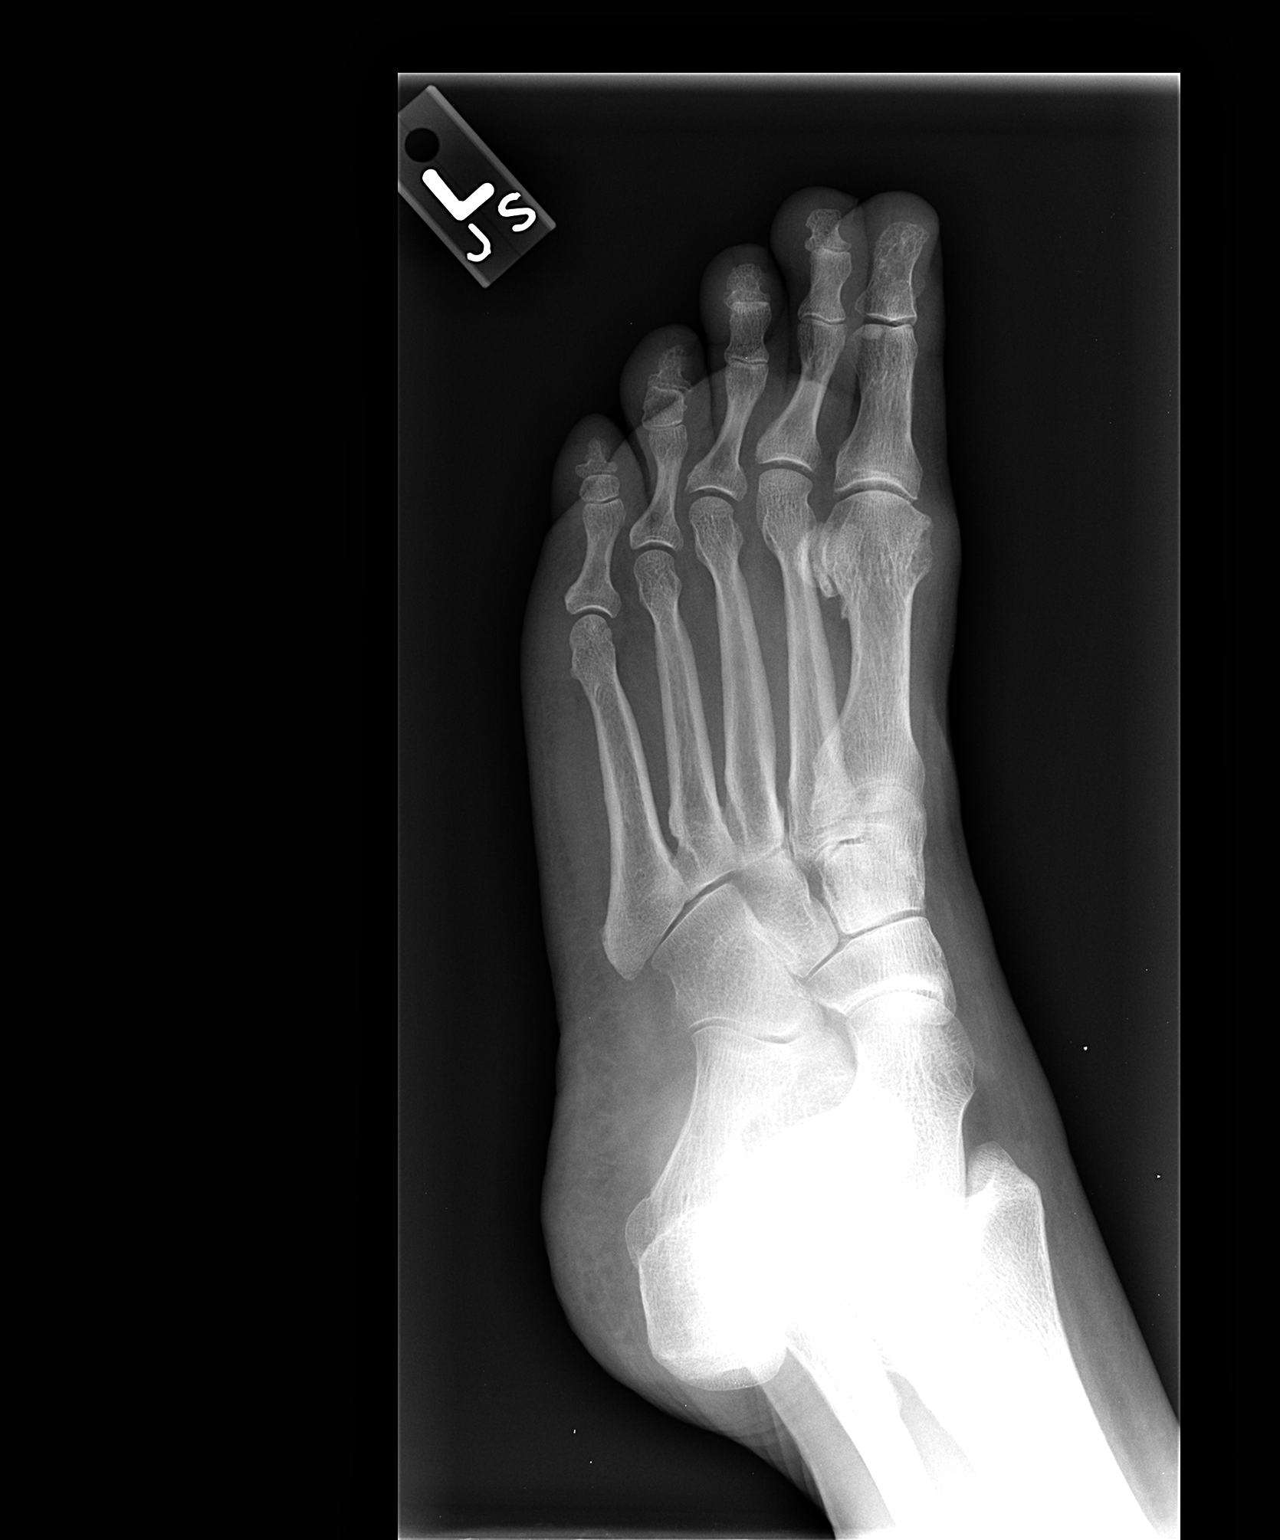

[view not recorded (3 of 3)]
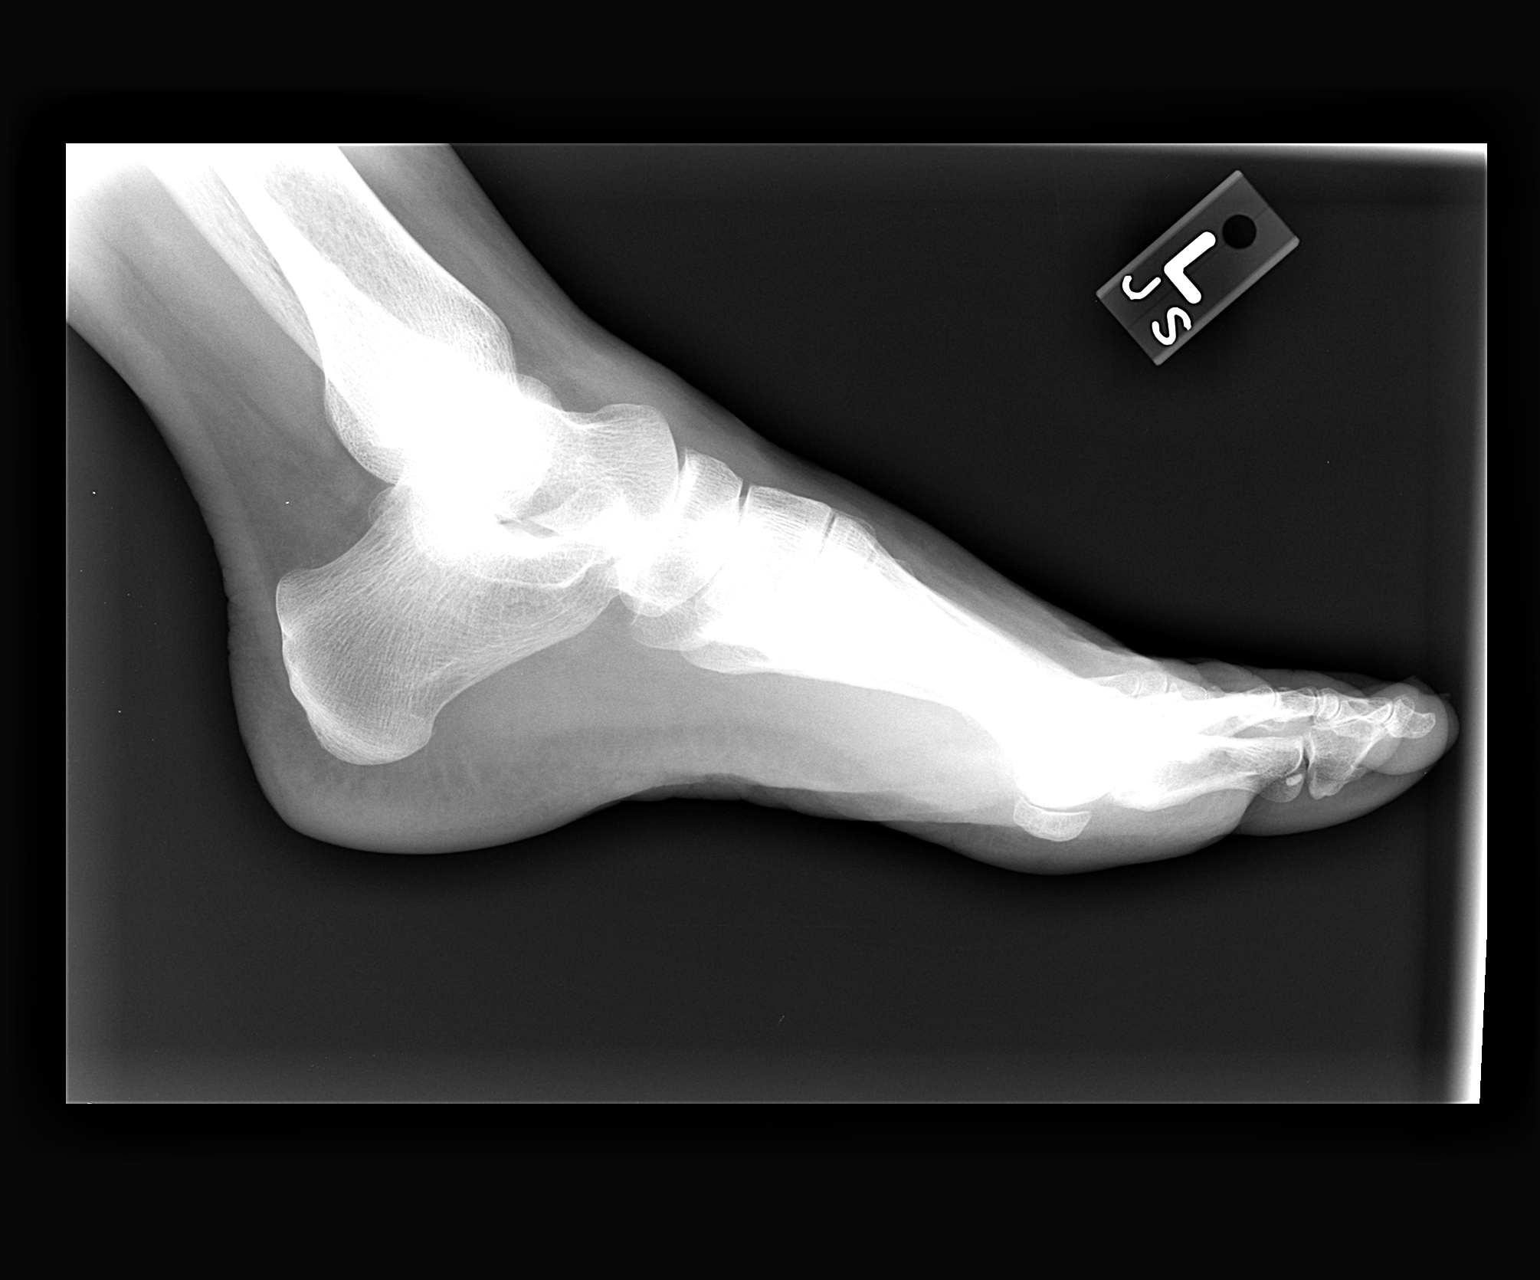

[3 of 3 positions shown; findings below may reference images not displayed]

FINDINGS: Early degenerative changes in the first MTP joint.  Other
joints remainder of the joint spaces are maintained.  Normal bone
mineralization. No acute bony abnormality.  Specifically, no
fracture, subluxation, or dislocation.  Soft tissues are intact.
IMPRESSION: No acute bony abnormality.

Early degenerative changes first MTP joint.

## 2013-07-27 ENCOUNTER — Other Ambulatory Visit: Payer: Self-pay

## 2013-07-28 ENCOUNTER — Encounter: Payer: Self-pay | Admitting: Family Medicine

## 2013-07-28 ENCOUNTER — Ambulatory Visit (INDEPENDENT_AMBULATORY_CARE_PROVIDER_SITE_OTHER): Payer: BC Managed Care – PPO | Admitting: Family Medicine

## 2013-07-28 VITALS — BP 132/84 | HR 77 | Wt 132.0 lb

## 2013-07-28 DIAGNOSIS — M549 Dorsalgia, unspecified: Secondary | ICD-10-CM

## 2013-07-28 NOTE — Progress Notes (Signed)
CC: Acute low back pain after fall  HPI: Patient is a very pleasant 62 year old female coming in with back pain after fall. 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 Patient was going down a driveway and slipped onto her backside. Patient states her lower back to did not hit her head. There is no loss of consciousness. Patient states that she's had a dull aching throbbing sensation that is worse with forward flexion since that time. She denies any radiation of pain down the legs or arms, denies any numbness or tingling or any weakness in her extremities. Patient states that worse with ambulation and somewhat better with lying down. Patient has tried some topical cream which has been beneficial. Patient did take one aspirin which did help as well. Patient was able to rest fairly comfortably once she was able to fall sleep last night. Patient was the pain is 6/10.  Past medical, surgical, family and social history reviewed. Medications reviewed all in the electronic medical record.   Review of Systems: No headache, visual changes, nausea, vomiting, diarrhea, constipation, dizziness, abdominal pain, skin rash, fevers, chills, night sweats, weight loss, swollen lymph nodes, body aches, joint swelling, muscle aches, chest pain, shortness of breath, mood changes.   Objective:    Blood pressure 132/84, pulse 77, weight 132 lb (59.875 kg).   General: No apparent distress alert and oriented x3 mood and affect normal, dressed appropriately.  HEENT: Pupils equal, extraocular movements intact Respiratory: Patient's speak in full sentences and does not appear short of breath Cardiovascular: No lower extremity edema, non tender, no erythema Skin: Warm dry intact with no signs of infection or rash on extremities or on axial skeleton. Abdomen: Soft nontender Neuro: Cranial nerves II through XII are intact, neurovascularly intact in all extremities with 2+ DTRs and 2+ pulses. Lymph: No lymphadenopathy of posterior  or anterior cervical chain or axillae bilaterally.  Gait normal with good balance and coordination.  MSK: Non tender with full range of motion and good stability and symmetric strength and tone of shoulders, elbows, wrist, hip, knee and ankles bilaterally.  Back Exam:  Inspection: Unremarkable  Motion: Flexion 35 deg, Extension 45 deg, Side Bending to 45 deg bilaterally,  Rotation to 45 deg bilaterally  SLR laying: Negative  XSLR laying: Negative  Palpable tenderness: Mild tenderness to palpation over the lower lumbar paraspinal muscle bilaterally. FABER: negative. Sensory change: Gross sensation intact to all lumbar and sacral dermatomes.  Reflexes: 2+ at both patellar tendons, 2+ at achilles tendons, Babinski's downgoing.  Strength at foot  Plantar-flexion: 5/5 Dorsi-flexion: 5/5 Eversion: 5/5 Inversion: 5/5  Leg strength  Quad: 5/5 Hamstring: 5/5 Hip flexor: 5/5 Hip abductors: 5/5  Gait unremarkable.    Impression and Recommendations:     This case required medical decision making of moderate complexity.

## 2013-07-28 NOTE — Patient Instructions (Signed)
Very nice to meet you Ibuprofen 600mg  3 times a day for 3 days.  Only stop it if it hurts your stomach.  Ice 20 minutes 2 times a day for next 24 hours.  Then can use heat or ice after that Try cream if you want still.  Exercises starting tomorrow Come back in 1 week if not better.

## 2013-07-28 NOTE — Assessment & Plan Note (Signed)
The patient has no signs of herniated disc or compression fracture with no spinous process tenderness. Patient does have what appears to be a muscle spasm. She seems to be able to do all her activities of daily living. We discussed different treatment options and modalities. We will try over-the-counter anti-inflammatories for the next 3 days, discussed icing protocol and was given home exercise program. If patient does not make any significant strives of improvement in the next week we will have her come back for further evaluation and likely x-rays. I anticipate patient to fully recovery within the next 48 hours.

## 2013-09-26 ENCOUNTER — Telehealth: Payer: Self-pay | Admitting: Internal Medicine

## 2013-09-26 NOTE — Telephone Encounter (Signed)
Pt called request to be work in for physical, pt medication for Xanax is going to run out before Feb 2015. Please advise.

## 2013-09-26 NOTE — Telephone Encounter (Signed)
Ok to work in for [employer] before end of March. Can renew Xanax Rx in the interim, until appt.

## 2013-09-26 NOTE — Telephone Encounter (Signed)
Pt has an appt 2.5.15

## 2013-10-26 ENCOUNTER — Encounter: Payer: Self-pay | Admitting: Internal Medicine

## 2013-10-26 ENCOUNTER — Other Ambulatory Visit: Payer: BC Managed Care – PPO

## 2013-10-26 ENCOUNTER — Ambulatory Visit (INDEPENDENT_AMBULATORY_CARE_PROVIDER_SITE_OTHER): Payer: BC Managed Care – PPO | Admitting: Internal Medicine

## 2013-10-26 VITALS — BP 130/78 | HR 82 | Temp 97.2°F | Ht 63.0 in | Wt 133.8 lb

## 2013-10-26 DIAGNOSIS — Z0000 Encounter for general adult medical examination without abnormal findings: Secondary | ICD-10-CM

## 2013-10-26 DIAGNOSIS — M810 Age-related osteoporosis without current pathological fracture: Secondary | ICD-10-CM

## 2013-10-26 DIAGNOSIS — F411 Generalized anxiety disorder: Secondary | ICD-10-CM

## 2013-10-26 DIAGNOSIS — G4733 Obstructive sleep apnea (adult) (pediatric): Secondary | ICD-10-CM

## 2013-10-26 LAB — BASIC METABOLIC PANEL
BUN: 8 mg/dL (ref 6–23)
CALCIUM: 9.7 mg/dL (ref 8.4–10.5)
CO2: 29 mEq/L (ref 19–32)
Chloride: 101 mEq/L (ref 96–112)
Creatinine, Ser: 0.9 mg/dL (ref 0.4–1.2)
GFR: 72.1 mL/min (ref >60.00)
GLUCOSE: 97 mg/dL (ref 70–99)
POTASSIUM: 4.2 meq/L (ref 3.5–5.1)
SODIUM: 138 meq/L (ref 135–145)

## 2013-10-26 MED ORDER — ALPRAZOLAM 0.5 MG PO TABS: ORAL_TABLET | ORAL | Status: DC

## 2013-10-26 NOTE — Progress Notes (Signed)
Pre visit review using our clinic review tool, if applicable. No additional management support is needed unless otherwise documented below in the visit note. 
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
## 2013-10-26 NOTE — Patient Instructions (Signed)
Thanks for coming in today and thank you for your trust and confidence of the years. Dr. Posey ReaPlotnikov is an excellent doctor and will do a very good job of taking care of you.  Your exam today is fine - your throat is not red and there is no "pus." Recommend using a gargle of choice. A cough syrup with mucinex (guafenesin) and a expectorant at bedtime will be helpful. Call if you have serious problems with swallow or breathing.  Please return at your convenience for a nurse visit for a Tdap and Shingles vaccine.  Lab today - basic metabolic panel - results to be posted to MyChart.

## 2013-10-29 NOTE — Assessment & Plan Note (Signed)
Interval history is unremarkable for any new problems. Limited physical exam is normal. Reviewed previous labs - no problems. Reviewed Bmet - normal. She is current with colorectal and breast cancer screening. She is willing to take pnuemonia vaccine and shingles vaccine but feels under the weather and defers to day.   In summary A very nice woman who is medically stable on her current regimen. She will return for immunizations.

## 2013-10-29 NOTE — Assessment & Plan Note (Signed)
H/o mild sleep apena. She has opted to ot use CPAP. NO significant sympotms of hypersomnolence.

## 2013-10-29 NOTE — Assessment & Plan Note (Signed)
Lest DEXA Feb '13. She is taking calcium supplement and vitamin D  Plan  continue calcium and vit D  Consider follow up DEXA

## 2013-10-29 NOTE — Assessment & Plan Note (Signed)
No change in condition. Continues to do well with alprazolam. Did discuss concerns about benzodiazepines.  Plan Renew Rx for alprazolam.

## 2013-12-09 ENCOUNTER — Observation Stay (HOSPITAL_COMMUNITY)
Admission: EM | Admit: 2013-12-09 | Discharge: 2013-12-11 | Disposition: A | Discharge status: Home / Self Care | Payer: BC Managed Care – PPO | Attending: Cardiology | Admitting: Cardiology

## 2013-12-09 ENCOUNTER — Telehealth (HOSPITAL_BASED_OUTPATIENT_CLINIC_OR_DEPARTMENT_OTHER): Payer: Self-pay | Admitting: Emergency Medicine

## 2013-12-09 ENCOUNTER — Encounter: Payer: Self-pay | Admitting: Family Medicine

## 2013-12-09 ENCOUNTER — Emergency Department (HOSPITAL_COMMUNITY): Payer: BC Managed Care – PPO

## 2013-12-09 ENCOUNTER — Ambulatory Visit (INDEPENDENT_AMBULATORY_CARE_PROVIDER_SITE_OTHER): Payer: BC Managed Care – PPO | Admitting: Family Medicine

## 2013-12-09 ENCOUNTER — Encounter (HOSPITAL_COMMUNITY): Payer: Self-pay | Admitting: Emergency Medicine

## 2013-12-09 VITALS — BP 120/86 | HR 96 | Temp 98.3°F | Wt 132.0 lb

## 2013-12-09 DIAGNOSIS — I480 Paroxysmal atrial fibrillation: Secondary | ICD-10-CM | POA: Diagnosis present

## 2013-12-09 DIAGNOSIS — M810 Age-related osteoporosis without current pathological fracture: Secondary | ICD-10-CM | POA: Insufficient documentation

## 2013-12-09 DIAGNOSIS — R42 Dizziness and giddiness: Secondary | ICD-10-CM | POA: Insufficient documentation

## 2013-12-09 DIAGNOSIS — Z888 Allergy status to other drugs, medicaments and biological substances status: Secondary | ICD-10-CM | POA: Insufficient documentation

## 2013-12-09 DIAGNOSIS — I4891 Unspecified atrial fibrillation: Principal | ICD-10-CM | POA: Insufficient documentation

## 2013-12-09 DIAGNOSIS — K589 Irritable bowel syndrome without diarrhea: Secondary | ICD-10-CM | POA: Insufficient documentation

## 2013-12-09 DIAGNOSIS — R55 Syncope and collapse: Secondary | ICD-10-CM | POA: Insufficient documentation

## 2013-12-09 DIAGNOSIS — F411 Generalized anxiety disorder: Secondary | ICD-10-CM | POA: Insufficient documentation

## 2013-12-09 DIAGNOSIS — Z8701 Personal history of pneumonia (recurrent): Secondary | ICD-10-CM | POA: Insufficient documentation

## 2013-12-09 DIAGNOSIS — Z91040 Latex allergy status: Secondary | ICD-10-CM | POA: Insufficient documentation

## 2013-12-09 DIAGNOSIS — R0789 Other chest pain: Secondary | ICD-10-CM | POA: Insufficient documentation

## 2013-12-09 DIAGNOSIS — F439 Reaction to severe stress, unspecified: Secondary | ICD-10-CM

## 2013-12-09 DIAGNOSIS — G4733 Obstructive sleep apnea (adult) (pediatric): Secondary | ICD-10-CM | POA: Insufficient documentation

## 2013-12-09 DIAGNOSIS — R0683 Snoring: Secondary | ICD-10-CM | POA: Diagnosis present

## 2013-12-09 DIAGNOSIS — R079 Chest pain, unspecified: Secondary | ICD-10-CM

## 2013-12-09 DIAGNOSIS — Z79899 Other long term (current) drug therapy: Secondary | ICD-10-CM | POA: Insufficient documentation

## 2013-12-09 HISTORY — DX: Cardiac arrhythmia, unspecified: I49.9

## 2013-12-09 HISTORY — DX: Gastro-esophageal reflux disease without esophagitis: K21.9

## 2013-12-09 LAB — CBC WITH DIFFERENTIAL/PLATELET
BASOS ABS: 0 10*3/uL (ref 0.0–0.1)
Basophils Relative: 0 % (ref 0–1)
Eosinophils Absolute: 0.2 10*3/uL (ref 0.0–0.7)
Eosinophils Relative: 2 % (ref 0–5)
HEMATOCRIT: 40.8 % (ref 36.0–46.0)
Hemoglobin: 14.4 g/dL (ref 12.0–15.0)
LYMPHS ABS: 3.3 10*3/uL (ref 0.7–4.0)
LYMPHS PCT: 34 % (ref 12–46)
MCH: 31.4 pg (ref 26.0–34.0)
MCHC: 35.3 g/dL (ref 30.0–36.0)
MCV: 89.1 fL (ref 78.0–100.0)
MONO ABS: 0.5 10*3/uL (ref 0.1–1.0)
Monocytes Relative: 6 % (ref 3–12)
Neutro Abs: 5.7 10*3/uL (ref 1.7–7.7)
Neutrophils Relative %: 58 % (ref 43–77)
PLATELETS: 255 10*3/uL (ref 150–400)
RBC: 4.58 MIL/uL (ref 3.87–5.11)
RDW: 12.9 % (ref 11.5–15.5)
WBC: 9.8 10*3/uL (ref 4.0–10.5)

## 2013-12-09 LAB — BASIC METABOLIC PANEL
BUN: 15 mg/dL (ref 6–23)
CHLORIDE: 99 meq/L (ref 96–112)
CO2: 24 mEq/L (ref 19–32)
Calcium: 9.6 mg/dL (ref 8.4–10.5)
Creatinine, Ser: 0.99 mg/dL (ref 0.50–1.10)
GFR calc Af Amer: 70 mL/min — ABNORMAL LOW (ref >90)
GFR calc non Af Amer: 60 mL/min — ABNORMAL LOW (ref >90)
Glucose, Bld: 114 mg/dL — ABNORMAL HIGH (ref 70–99)
Potassium: 3.9 mEq/L (ref 3.7–5.3)
Sodium: 138 mEq/L (ref 137–147)

## 2013-12-09 LAB — URINALYSIS, ROUTINE W REFLEX MICROSCOPIC
Bilirubin Urine: NEGATIVE
GLUCOSE, UA: NEGATIVE mg/dL
Ketones, ur: NEGATIVE mg/dL
LEUKOCYTES UA: NEGATIVE
Nitrite: NEGATIVE
Protein, ur: NEGATIVE mg/dL
SPECIFIC GRAVITY, URINE: 1.008 (ref 1.005–1.030)
Urobilinogen, UA: 0.2 mg/dL (ref 0.0–1.0)
pH: 7 (ref 5.0–8.0)

## 2013-12-09 LAB — MAGNESIUM: Magnesium: 2 mg/dL (ref 1.5–2.5)

## 2013-12-09 LAB — PRO B NATRIURETIC PEPTIDE: PRO B NATRI PEPTIDE: 691.1 pg/mL — AB (ref 0–125)

## 2013-12-09 LAB — TROPONIN I

## 2013-12-09 LAB — URINE MICROSCOPIC-ADD ON

## 2013-12-09 MED ORDER — DIPHENHYDRAMINE HCL 12.5 MG/5ML PO LIQD
12.5000 mg | Freq: Every evening | ORAL | Status: DC | PRN
  Filled 2013-12-09: qty 5

## 2013-12-09 MED ORDER — DILTIAZEM HCL 100 MG IV SOLR
5.0000 mg/h | INTRAVENOUS | Status: DC
  Administered 2013-12-09: 15 mg/h via INTRAVENOUS
  Administered 2013-12-10: 10 mg/h via INTRAVENOUS
  Filled 2013-12-09 (×2): qty 100

## 2013-12-09 MED ORDER — ALPRAZOLAM 0.25 MG PO TABS
0.2500 mg | ORAL_TABLET | Freq: Three times a day (TID) | ORAL | Status: DC | PRN
  Administered 2013-12-09 – 2013-12-10 (×4): 0.25 mg via ORAL
  Filled 2013-12-09 (×4): qty 1

## 2013-12-09 MED ORDER — SODIUM CHLORIDE 0.9 % IJ SOLN: 3.0000 mL | INTRAMUSCULAR | Status: DC | PRN

## 2013-12-09 MED ORDER — HEPARIN SODIUM (PORCINE) 5000 UNIT/ML IJ SOLN
5000.0000 [IU] | Freq: Three times a day (TID) | INTRAMUSCULAR | Status: DC
Start: 2013-12-09 — End: 2013-12-11
  Administered 2013-12-09 – 2013-12-10 (×3): 5000 [IU] via SUBCUTANEOUS
  Filled 2013-12-09 (×9): qty 1

## 2013-12-09 MED ORDER — DILTIAZEM HCL 25 MG/5ML IV SOLN
20.0000 mg | Freq: Once | INTRAVENOUS | Status: AC
  Administered 2013-12-09: 20 mg via INTRAVENOUS

## 2013-12-09 MED ORDER — CALCIUM CARBONATE 600 MG PO TABS
600.0000 mg | ORAL_TABLET | Freq: Two times a day (BID) | ORAL | Status: DC
  Filled 2013-12-09: qty 1

## 2013-12-09 MED ORDER — DILTIAZEM HCL 100 MG IV SOLR
5.0000 mg/h | INTRAVENOUS | Status: DC
  Administered 2013-12-09: 5 mg/h via INTRAVENOUS
  Filled 2013-12-09: qty 100

## 2013-12-09 MED ORDER — SODIUM CHLORIDE 0.9 % IV SOLN
INTRAVENOUS | Status: DC
  Administered 2013-12-09: 11:00:00 via INTRAVENOUS

## 2013-12-09 MED ORDER — OFF THE BEAT BOOK
Freq: Once | Status: AC
  Administered 2013-12-09: 17:00:00
  Filled 2013-12-09: qty 1

## 2013-12-09 MED ORDER — DILTIAZEM LOAD VIA INFUSION
10.0000 mg | Freq: Once | INTRAVENOUS | Status: AC
  Administered 2013-12-09: 10 mg via INTRAVENOUS
  Filled 2013-12-09: qty 10

## 2013-12-09 MED ORDER — CALCIUM CARBONATE 1250 (500 CA) MG PO TABS
1.0000 | ORAL_TABLET | Freq: Two times a day (BID) | ORAL | Status: DC
  Administered 2013-12-10 – 2013-12-11 (×3): 500 mg via ORAL
  Filled 2013-12-09 (×5): qty 1

## 2013-12-09 MED ORDER — ADULT MULTIVITAMIN W/MINERALS CH
1.0000 | ORAL_TABLET | Freq: Every day | ORAL | Status: DC
  Administered 2013-12-10 – 2013-12-11 (×2): 1 via ORAL
  Filled 2013-12-09 (×2): qty 1

## 2013-12-09 MED ORDER — SODIUM CHLORIDE 0.9 % IJ SOLN
3.0000 mL | Freq: Two times a day (BID) | INTRAMUSCULAR | Status: DC
  Administered 2013-12-10 – 2013-12-11 (×3): 3 mL via INTRAVENOUS

## 2013-12-09 MED ORDER — POLY-VITAMINS PO CHEW: 1.0000 | CHEWABLE_TABLET | Freq: Every day | ORAL | Status: DC

## 2013-12-09 MED ORDER — SODIUM CHLORIDE 0.9 % IV SOLN: 250.0000 mL | INTRAVENOUS | Status: DC | PRN

## 2013-12-09 MED ORDER — CALCIUM CARBONATE ANTACID 500 MG PO CHEW
2.0000 | CHEWABLE_TABLET | Freq: Every evening | ORAL | Status: DC | PRN
  Filled 2013-12-09: qty 2

## 2013-12-09 NOTE — ED Notes (Signed)
Cardiology at bedside.

## 2013-12-09 NOTE — Assessment & Plan Note (Signed)
Discussed with patient at great length. Patient is hemodynamically stable and this seems to be a new onset of atrial fibrillation. This patient's history this has been approximately 1-2 weeks in duration. Patient though with rate control on exam today the patient's EKG does have a rate of 132 which means patient would need medications. We discussed possibly doing that with a quick followup with cardiology the with patient's anxiety I did feel the patient would do better evaluation of cardiology with inpatient. Patient was sent to the emergency department for further evaluation. Patient likely will need to have an echocardiogram but no murmur was appreciated today. Patient's [place] score is zero which means no anticoagulation is likely necessary. All the patient's questions were addressed.  Spent greater than 45 minutes with patient face-to-face and had greater than 50% of counseling including as described above in assessment and plan.

## 2013-12-09 NOTE — Patient Instructions (Signed)
Nice to meet you You do have new onset atrial fibrillation.  [facility] . They have cardiology there.  If you need anything else we are here but it would be a good idea to be checked out sooner then later.

## 2013-12-09 NOTE — ED Notes (Signed)
Diltiazem rate changed to 15 mg/hr
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
## 2013-12-09 NOTE — Progress Notes (Signed)
  Tawana ScaleZach Smith D.O. [facility] [address] [place], [place] [address] Phone: [phone] Subjective:     CC: Lightheaded heartbeats fast  BJY:[id number]:Subjective Mekiyah W Roselee Novaltman is a 63 y.o. female coming in with complaint of anxiety and feeling like she may pass out. Patient also states that sometimes her physical speeding fast. Patient states is upgoing on for the last 2 weeks. Patient states from time to time she feels very lightheaded. Patient does work as a Interior and spatial designerhairdresser and unfortunately unable to work because she is feeling very lightheaded. Patient denies any radiation or any weakness in any extremities. Patient states that she does seem to be more tired. Patient has tried changing her diet and stopped having caffeine recently. Patient denies any new medications or any over-the-counter medications. Patient does have a past medical history significant for anxiety but states that this feels significantly different but does have an extreme amount of stress in her life right now.     Past medical history, social, surgical and family history all reviewed in electronic medical record.   Review of Systems: No headache, visual changes, nausea, vomiting, diarrhea, constipation, dizziness, abdominal pain, skin rash, fevers, chills, night sweats, weight loss, swollen lymph nodes, body aches, joint swelling, muscle aches, chest pain, shortness of breath, mood changes.   Objective Blood pressure 120/86, pulse 96, temperature 98.3 F (36.8 C), temperature source Oral, weight 132 lb (59.875 kg), SpO2 98.00%.  General: No apparent distress alert and oriented x3 mood and affect normal, dressed appropriately. Patient does seem somewhat anxious HEENT: Pupils equal, extraocular movements intact  Respiratory: Patient's speak in full sentences and does not appear short of breath clear to auscultation Cardiovascular: No lower extremity edema, non tender, no erythem irregular rate and rhythm Skin: Warm dry  intact with no signs of infection or rash on extremities or on axial skeleton.  Abdomen: Soft nontender  Neuro: Cranial nerves II through XII are intact, neurovascularly intact in all extremities with 2+ DTRs and 2+ pulses.  Lymph: No lymphadenopathy of posterior or anterior cervical chain or axillae bilaterally.  Gait normal with good balance and coordination.  MSK:  Non tender with full range of motion and good stability and symmetric strength and tone of shoulders, elbows, wrist, hip, knee and ankles bilaterally.   EKG was interpreted by me today. Patient's EKG does have what appears to be significant ectopic beats with questionable atrial fibrillation. This was redone and did show atrial fibrillation. Normal axis   Impression and Recommendations:     This case required medical decision making of moderate complexity.

## 2013-12-09 NOTE — ED Notes (Signed)
Pt presents to department for new onset atrial fibrillation. [facility] for [facility]. Pt states palpitations, near syncope and chest pressure upon arrival to ED. 9/10 discomfort. Pt is alert and oriented x4. Respirations unlabored.

## 2013-12-10 LAB — BASIC METABOLIC PANEL
BUN: 11 mg/dL (ref 6–23)
CHLORIDE: 100 meq/L (ref 96–112)
CO2: 24 meq/L (ref 19–32)
CREATININE: 0.84 mg/dL (ref 0.50–1.10)
Calcium: 9.3 mg/dL (ref 8.4–10.5)
GFR calc Af Amer: 85 mL/min — ABNORMAL LOW (ref >90)
GFR calc non Af Amer: 74 mL/min — ABNORMAL LOW (ref >90)
Glucose, Bld: 108 mg/dL — ABNORMAL HIGH (ref 70–99)
Potassium: 4 mEq/L (ref 3.7–5.3)
Sodium: 137 mEq/L (ref 137–147)

## 2013-12-10 LAB — CBC
HEMATOCRIT: 38.8 % (ref 36.0–46.0)
Hemoglobin: 13.4 g/dL (ref 12.0–15.0)
MCH: 30.5 pg (ref 26.0–34.0)
MCHC: 34.5 g/dL (ref 30.0–36.0)
MCV: 88.4 fL (ref 78.0–100.0)
Platelets: 240 10*3/uL (ref 150–400)
RBC: 4.39 MIL/uL (ref 3.87–5.11)
RDW: 12.9 % (ref 11.5–15.5)
WBC: 10.3 10*3/uL (ref 4.0–10.5)

## 2013-12-10 LAB — HEMOGLOBIN A1C
Hgb A1c MFr Bld: 5.4 % (ref <5.7)
Mean Plasma Glucose: 108 mg/dL (ref <117)

## 2013-12-10 LAB — URINE CULTURE
Colony Count: NO GROWTH
Culture: NO GROWTH

## 2013-12-10 LAB — TSH: TSH: 2.749 u[IU]/mL (ref 0.350–4.500)

## 2013-12-10 MED ORDER — METOPROLOL TARTRATE 25 MG PO TABS
25.0000 mg | ORAL_TABLET | Freq: Two times a day (BID) | ORAL | Status: DC
  Administered 2013-12-10 – 2013-12-11 (×3): 25 mg via ORAL
  Filled 2013-12-10 (×4): qty 1

## 2013-12-10 MED ORDER — ACETAMINOPHEN 325 MG PO TABS
650.0000 mg | ORAL_TABLET | Freq: Once | ORAL | Status: AC
  Administered 2013-12-10: 650 mg via ORAL
  Filled 2013-12-10: qty 2

## 2013-12-10 NOTE — Progress Notes (Signed)
Pt ambulated down hall to nurses station and back twice today, HR sustained between 80-113, pt asymptomatic.

## 2013-12-10 NOTE — Progress Notes (Signed)
SUBJECTIVE:  No complaints.  She might feel her heart a little bit when she is changing position in the bed.   PHYSICAL EXAM Filed Vitals:   12/09/13 1630 12/09/13 1645 12/09/13 2131 12/10/13 0514  BP: 127/47 126/67 114/52 111/51  Pulse: 94 104 83 83  Temp:   98.2 F (36.8 C) 98.1 F (36.7 C)  TempSrc:   Oral Oral  Resp: 11 14 18 18   SpO2: 99% 100% 97% 98%   General:  No distress Lungs:  Clear Heart:  RRR Abdomen:  Positive bowel sounds, no rebound no guarding Extremities:  No edema  LABS: Lab Results  Component Value Date   TROPONINI <0.30 12/09/2013   Results for orders placed during the hospital encounter of 12/09/13 (from the past 24 hour(s))  CBC WITH DIFFERENTIAL     Status: None   Collection Time    12/09/13 10:38 AM      Result Value Ref Range   WBC 9.8  4.0 - 10.5 K/uL   RBC 4.58  3.87 - 5.11 MIL/uL   Hemoglobin 14.4  12.0 - 15.0 g/dL   HCT 40.9  81.1 - 91.4 %   MCV 89.1  78.0 - 100.0 fL   MCH 31.4  26.0 - 34.0 pg   MCHC 35.3  30.0 - 36.0 g/dL   RDW 78.2  95.6 - 21.3 %   Platelets 255  150 - 400 K/uL   Neutrophils Relative % 58  43 - 77 %   Neutro Abs 5.7  1.7 - 7.7 K/uL   Lymphocytes Relative 34  12 - 46 %   Lymphs Abs 3.3  0.7 - 4.0 K/uL   Monocytes Relative 6  3 - 12 %   Monocytes Absolute 0.5  0.1 - 1.0 K/uL   Eosinophils Relative 2  0 - 5 %   Eosinophils Absolute 0.2  0.0 - 0.7 K/uL   Basophils Relative 0  0 - 1 %   Basophils Absolute 0.0  0.0 - 0.1 K/uL  BASIC METABOLIC PANEL     Status: Abnormal   Collection Time    12/09/13 10:38 AM      Result Value Ref Range   Sodium 138  137 - 147 mEq/L   Potassium 3.9  3.7 - 5.3 mEq/L   Chloride 99  96 - 112 mEq/L   CO2 24  19 - 32 mEq/L   Glucose, Bld 114 (*) 70 - 99 mg/dL   BUN 15  6 - 23 mg/dL   Creatinine, Ser 0.86  0.50 - 1.10 mg/dL   Calcium 9.6  8.4 - 57.8 mg/dL   GFR calc non Af Amer 60 (*) >90 mL/min   GFR calc Af Amer 70 (*) >90 mL/min  TROPONIN I     Status: None   Collection Time   12/09/13 10:38 AM      Result Value Ref Range   Troponin I <0.30  <0.30 ng/mL  PRO B NATRIURETIC PEPTIDE     Status: Abnormal   Collection Time    12/09/13 10:38 AM      Result Value Ref Range   Pro B Natriuretic peptide (BNP) 691.1 (*) 0 - 125 pg/mL  MAGNESIUM     Status: None   Collection Time    12/09/13 10:38 AM      Result Value Ref Range   Magnesium 2.0  1.5 - 2.5 mg/dL  URINALYSIS, ROUTINE W REFLEX MICROSCOPIC     Status: Abnormal   Collection Time  12/09/13 11:15 AM      Result Value Ref Range   Color, Urine YELLOW  YELLOW   APPearance CLEAR  CLEAR   Specific Gravity, Urine 1.008  1.005 - 1.030   pH 7.0  5.0 - 8.0   Glucose, UA NEGATIVE  NEGATIVE mg/dL   Hgb urine dipstick SMALL (*) NEGATIVE   Bilirubin Urine NEGATIVE  NEGATIVE   Ketones, ur NEGATIVE  NEGATIVE mg/dL   Protein, ur NEGATIVE  NEGATIVE mg/dL   Urobilinogen, UA 0.2  0.0 - 1.0 mg/dL   Nitrite NEGATIVE  NEGATIVE   Leukocytes, UA NEGATIVE  NEGATIVE  URINE MICROSCOPIC-ADD ON     Status: None   Collection Time    12/09/13 11:15 AM      Result Value Ref Range   RBC / HPF 0-2  <3 RBC/hpf  CBC     Status: None   Collection Time    12/10/13  4:08 AM      Result Value Ref Range   WBC 10.3  4.0 - 10.5 K/uL   RBC 4.39  3.87 - 5.11 MIL/uL   Hemoglobin 13.4  12.0 - 15.0 g/dL   HCT 40.938.8  81.136.0 - 91.446.0 %   MCV 88.4  78.0 - 100.0 fL   MCH 30.5  26.0 - 34.0 pg   MCHC 34.5  30.0 - 36.0 g/dL   RDW 78.212.9  95.611.5 - 21.315.5 %   Platelets 240  150 - 400 K/uL  BASIC METABOLIC PANEL     Status: Abnormal   Collection Time    12/10/13  4:08 AM      Result Value Ref Range   Sodium 137  137 - 147 mEq/L   Potassium 4.0  3.7 - 5.3 mEq/L   Chloride 100  96 - 112 mEq/L   CO2 24  19 - 32 mEq/L   Glucose, Bld 108 (*) 70 - 99 mg/dL   BUN 11  6 - 23 mg/dL   Creatinine, Ser 0.860.84  0.50 - 1.10 mg/dL   Calcium 9.3  8.4 - 57.810.5 mg/dL   GFR calc non Af Amer 74 (*) >90 mL/min   GFR calc Af Amer 85 (*) >90 mL/min    Intake/Output  Summary (Last 24 hours) at 12/10/13 0818 Last data filed at 12/10/13 0230  Gross per 24 hour  Intake    375 ml  Output      0 ml  Net    375 ml    ASSESSMENT AND PLAN:  Atrial fibrillation:  I will stop the IV Cardizem today.  She says that she cannot swallow pills without having them crushed.  She would not then be able to take Cardizem CD.  I will change to metoprolol 50 bid today.  I would like to watch her today since she is still having short bursts of atrial fib.     Fayrene FearingJames [facility] 12/10/2013 8:18 AM

## 2013-12-10 NOTE — Progress Notes (Signed)
Utilization review completed.  

## 2013-12-11 DIAGNOSIS — I369 Nonrheumatic tricuspid valve disorder, unspecified: Secondary | ICD-10-CM

## 2013-12-11 MED ORDER — METOPROLOL TARTRATE 25 MG PO TABS: 25.0000 mg | ORAL_TABLET | Freq: Two times a day (BID) | ORAL | Status: DC

## 2013-12-11 MED ORDER — FLECAINIDE ACETATE 50 MG PO TABS
50.0000 mg | ORAL_TABLET | Freq: Two times a day (BID) | ORAL | Status: DC
  Administered 2013-12-11: 50 mg via ORAL
  Filled 2013-12-11 (×2): qty 1

## 2013-12-11 MED ORDER — FLECAINIDE ACETATE 50 MG PO TABS: 50.0000 mg | ORAL_TABLET | Freq: Two times a day (BID) | ORAL | Status: DC

## 2013-12-11 NOTE — Discharge Instructions (Signed)
Atrial Fibrillation °Atrial fibrillation is a condition that causes your heart to beat irregularly. It may also cause your heart to beat faster than normal. Atrial fibrillation can prevent your heart from pumping blood normally. It increases your risk of stroke and heart problems. °HOME CARE °· Take medications as told by your doctor. °· Only take medications that your doctor says are safe. Some medications can make the condition worse or happen again. °· If blood thinners were prescribed by your doctor, take them exactly as told. Too much can cause bleeding. Too little and you will not have the needed protection against stroke and other problems. °· Perform blood tests at home if told by your doctor. °· Perform blood tests exactly as told by your doctor. °· Do not drink alcohol. °· Do not drink beverages with caffeine such as coffee, soda, and some teas. °· Maintain a healthy weight. °· Do not use diet pills unless your doctor says they are safe. They may make heart problems worse. °· Follow diet instructions as told by your doctor. °· Exercise regularly as told by your doctor. °· Keep all follow-up appointments. °GET HELP RIGHT AWAY IF:  °· You have chest or belly (abdominal) pain. °· You feel sick to your stomach (nauseous) °· You suddenly have swollen feet and ankles. °· You feel dizzy. °· You face, arms, or legs feel numb or weak. °· There is a change in your vision or speech. °· You notice a change in the speed, rhythm, or strength of your heartbeat. °· You suddenly begin peeing (urinating) more often. °· You get tired more easily when moving or exercising. °MAKE SURE YOU:  °· Understand these instructions. °· Will watch your condition. °· Will get help right away if you are not doing well or get worse. °Document Released: 06/16/2008 Document Revised: 01/02/2013 Document Reviewed: 10/18/2012 °ExitCare® Patient Information ©2014 [employer]. ° °

## 2013-12-11 NOTE — Discharge Summary (Signed)
Patient ID: Amber Shelton,  MRN: [id number], DOB/AGE: 1951/06/03 63 y.o.  Admit date: 12/09/2013 Discharge date: 12/11/2013  Primary Care Provider: Adella Hare, MD Primary Cardiologist: Dr Lenna Sciara. Hochrein (new)  Discharge Diagnoses Principal Problem:   PAF (paroxysmal atrial fibrillation) Active Problems:   Situational stress   OSA- "not bad enough for c-pap"    Procedures: Echo 12/11/13/   Hospital Course: 63 y/o female with no prior history of CAD or arrythmia, presented to the office 12/09/13 with symptomatic AF. She was admitted and started on Diltiazem IV and converted to NSR but she continued to have brief PAF. She was put on Lopressor and watched overnight. On the 23'd it was decided to get an echo and start low dose Flecainide as well as Lopressor. Her echo showed good LVF and it was felt she could be discharged. She has had problems taking aspirin in the past and it was felt she would be a low risk for an embolic event so no other anti platelet or oral anticoagulant was added. She'll need an OP 24 hr Holter and an exercise test as an OP followed by an OV with Dr Percival Spanish.   Discharge Vitals:  Blood pressure 103/56, pulse 65, temperature 97.9 F (36.6 C), temperature source Oral, resp. rate 18, height 5' 2" (1.575 m), weight 130 lb (58.968 kg), SpO2 100.00%.    Labs: Results for orders placed during the hospital encounter of 12/09/13 (from the past 48 hour(s))  CBC     Status: None   Collection Time    12/10/13  4:08 AM      Result Value Ref Range   WBC 10.3  4.0 - 10.5 K/uL   RBC 4.39  3.87 - 5.11 MIL/uL   Hemoglobin 13.4  12.0 - 15.0 g/dL   HCT 38.8  36.0 - 46.0 %   MCV 88.4  78.0 - 100.0 fL   MCH 30.5  26.0 - 34.0 pg   MCHC 34.5  30.0 - 36.0 g/dL   RDW 12.9  11.5 - 15.5 %   Platelets 240  150 - 400 K/uL  BASIC METABOLIC PANEL     Status: Abnormal   Collection Time    12/10/13  4:08 AM      Result Value Ref Range   Sodium 137  137 - 147 mEq/L   Potassium  4.0  3.7 - 5.3 mEq/L   Chloride 100  96 - 112 mEq/L   CO2 24  19 - 32 mEq/L   Glucose, Bld 108 (*) 70 - 99 mg/dL   BUN 11  6 - 23 mg/dL   Creatinine, Ser 0.84  0.50 - 1.10 mg/dL   Calcium 9.3  8.4 - 10.5 mg/dL   GFR calc non Af Amer 74 (*) >90 mL/min   GFR calc Af Amer 85 (*) >90 mL/min   Comment: (NOTE)     The eGFR has been calculated using the CKD EPI equation.     This calculation has not been validated in all clinical situations.     eGFR's persistently <90 mL/min signify possible Chronic Kidney     Disease.  HEMOGLOBIN A1C     Status: None   Collection Time    12/10/13  4:08 AM      Result Value Ref Range   Hemoglobin A1C 5.4  <5.7 %   Comment: (NOTE)  According to the ADA Clinical Practice Recommendations for 2011, when     HbA1c is used as a screening test:      >=6.5%   Diagnostic of Diabetes Mellitus               (if abnormal result is confirmed)     5.7-6.4%   Increased risk of developing Diabetes Mellitus     References:Diagnosis and Classification of Diabetes Mellitus,Diabetes     MLYY,5035,46(FKCLE 1):S62-S69 and Standards of Medical Care in             Diabetes - 2011,Diabetes XNTZ,0017,49 (Suppl 1):S11-S61.   Mean Plasma Glucose 108  <117 mg/dL   Comment: Performed at [facility]  TSH     Status: None   Collection Time    12/10/13  4:08 AM      Result Value Ref Range   TSH 2.749  0.350 - 4.500 uIU/mL   Comment: Performed at [facility]    Disposition:      Follow-up Information   Follow up with Minus Breeding, MD. (office will call you)    Specialty:  Cardiology   Contact information:   [address] [place] [address] [phone]     Office will call you with follow up instructions  Discharge Medications:    Medication List         ALPRAZolam 0.5 MG tablet  Commonly known as:  XANAX  Take 0.25 mg by mouth 3 (three) times daily as needed  for anxiety.     calcium carbonate 500 MG chewable tablet  Commonly known as:  TUMS - dosed in mg elemental calcium  Chew 2 tablets by mouth at bedtime as needed for indigestion.     calcium carbonate 600 MG Tabs tablet  Commonly known as:  OS-CAL  Take 600 mg by mouth 2 (two) times daily with a meal.     cholecalciferol 1000 UNITS tablet  Commonly known as:  VITAMIN D  Take by mouth. Gummy Vit-d 2000 units-Take 1-2 daily     diphenhydrAMINE 12.5 MG/5ML liquid  Commonly known as:  BENADRYL  Take 12.5 mg by mouth at bedtime as needed. Sleep/ allergic reaction.     flecainide 50 MG tablet  Commonly known as:  TAMBOCOR  Take 1 tablet (50 mg total) by mouth every 12 (twelve) hours.     metoprolol tartrate 25 MG tablet  Commonly known as:  LOPRESSOR  Take 1 tablet (25 mg total) by mouth 2 (two) times daily.     multivitamin Chew  Chew 1 tablet by mouth daily.     Vitamin C 500 MG Chew  Chew 1 each by mouth daily.         Duration of Discharge Encounter: Greater than 30 minutes including physician time.  Angelena Form PA-C 12/11/2013 11:47 AM

## 2013-12-11 NOTE — Progress Notes (Signed)
Reviewed discharge instructions with patient and she stated her understanding.  Plan followup with cardiology.  Discharged home with husband.  Colman Caterarpley, Lleyton Byers Danielle

## 2013-12-11 NOTE — Progress Notes (Signed)
Late entry, pt reported that she noted increased thirst, dry mouth, frequent urination after metoprolol given last pm.  All vital signs WNL.  Will continue to monitor.

## 2013-12-11 NOTE — Progress Notes (Addendum)
    Subjective:  No complaints, unaware of PAF since admission.  Objective:  Vital Signs in the last 24 hours: Temp:  [97.9 F (36.6 C)-98.7 F (37.1 C)] 97.9 F (36.6 C) (03/23 0600) Pulse Rate:  [65-91] 65 (03/23 0600) Resp:  [17-18] 18 (03/23 0600) BP: (100-127)/(56-83) 103/56 mmHg (03/23 0600) SpO2:  [97 %-100 %] 100 % (03/23 0600)  Intake/Output from previous day:  Intake/Output Summary (Last 24 hours) at 12/11/13 0730 Last data filed at 12/10/13 1700  Gross per 24 hour  Intake    840 ml  Output      0 ml  Net    840 ml    Physical Exam: General appearance: alert, cooperative and no distress Lungs: clear to auscultation bilaterally Heart: regular rate and rhythm   Rate: 78  Rhythm: normal sinus rhythm, premature atrial contractions (PAC) and runs of PAF  Lab Results:  Recent Labs  12/09/13 1038 12/10/13 0408  WBC 9.8 10.3  HGB 14.4 13.4  PLT 255 240    Recent Labs  12/09/13 1038 12/10/13 0408  NA 138 137  K 3.9 4.0  CL 99 100  CO2 24 24  GLUCOSE 114* 108*  BUN 15 11  CREATININE 0.99 0.84    Recent Labs  12/09/13 1038  TROPONINI <0.30   No results found for this basename: INR,  in the last 72 hours  Imaging: Imaging results have been reviewed  Cardiac Studies:  Assessment/Plan:   Principal Problem:   PAF (paroxysmal atrial fibrillation) Active Problems:   Situational stress   OSA- "not bad enough for c-pap"    PLAN: She continues to have runs of brief PAF. Will review with MD- ? Antiarrythmic- ? Low dose Sotalol and ASA  Corine ShelterLuke Kilroy PA-C Beeper [phone] 12/11/2013, 7:30 AM   Patient seen, examined. Available data reviewed. Agree with findings, assessment, and plan as outlined by Corine ShelterLuke Kilroy, PA-C. The patient is alert and oriented in no acute distress. Heart is regular rate and rhythm without murmur or gallop. Lungs are clear to auscultation bilaterally. Abdomen is soft and nontender. There is no peripheral edema. Telemetry  reviewed: Patient is in sinus rhythm but continues to have frequent PACs and short bursts of atrial fibrillation. I think she is doing much better symptom control on an antiarrhythmic drug. Recommend initiation of flecainide 50 mg twice daily. I confirmed with the pharmacy that this can be crushed since she is unable to swallow pills. Will arrange a treadmill within 2 weeks of discharge as well as a 24 hour Holter monitor. Will do an echo this morning to rule out LV dysfunction or other structural heart disease. Will arrange followup with Dr. Antoine PocheHochrein for his NP/PA. No indication for anticoagulation (Chads Vasc = 1 for female gender). She cannot take ASA due to intolerance.   Tonny BollmanMichael Crimson Dubberly, M.D. 12/11/2013 8:08 AM

## 2013-12-11 NOTE — Progress Notes (Signed)
  Echocardiogram 2D Echocardiogram has been performed.  Arvil ChacoFoster, [place] Warzecha 12/11/2013, 9:47 AM

## 2013-12-12 ENCOUNTER — Other Ambulatory Visit: Payer: Self-pay | Admitting: Cardiology

## 2013-12-12 DIAGNOSIS — I480 Paroxysmal atrial fibrillation: Secondary | ICD-10-CM

## 2013-12-13 ENCOUNTER — Encounter: Payer: Self-pay | Admitting: Cardiology

## 2013-12-13 ENCOUNTER — Ambulatory Visit (INDEPENDENT_AMBULATORY_CARE_PROVIDER_SITE_OTHER): Payer: BC Managed Care – PPO | Admitting: Cardiology

## 2013-12-13 VITALS — BP 114/80 | HR 70 | Ht 62.0 in | Wt 133.8 lb

## 2013-12-13 DIAGNOSIS — I4891 Unspecified atrial fibrillation: Secondary | ICD-10-CM

## 2013-12-13 DIAGNOSIS — I480 Paroxysmal atrial fibrillation: Secondary | ICD-10-CM
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
## 2013-12-13 NOTE — Patient Instructions (Signed)
The current medical regimen is effective;  continue present plan and medications.  Your physician has recommended that you wear a holter monitor for 24 hours. Holter monitors are medical devices that record the heart's electrical activity. Doctors most often use these monitors to diagnose arrhythmias. Arrhythmias are problems with the speed or rhythm of the heartbeat. The monitor is a small, portable device. You can wear one while you do your normal daily activities. This is usually used to diagnose what is causing palpitations/syncope (passing out).  Please have blood work the same as your treadmill.  Do not take your Flecainide this AM.  You may take it after your lab work.  Follow up with Dr Antoine PocheHochrein in 2 months.

## 2013-12-19 ENCOUNTER — Encounter: Payer: Self-pay | Admitting: Radiology

## 2013-12-19 ENCOUNTER — Encounter (INDEPENDENT_AMBULATORY_CARE_PROVIDER_SITE_OTHER): Payer: BC Managed Care – PPO

## 2013-12-19 DIAGNOSIS — I480 Paroxysmal atrial fibrillation: Secondary | ICD-10-CM

## 2013-12-19 DIAGNOSIS — I4891 Unspecified atrial fibrillation: Secondary | ICD-10-CM

## 2013-12-19 NOTE — Progress Notes (Signed)
Patient ID: Amber CorpusMelba W Shelton, female   DOB: 07-29-1951, 63 y.o.   MRN: [id number] E cardio 24hr holter applied

## 2013-12-21 ENCOUNTER — Telehealth: Payer: Self-pay | Admitting: Physician Assistant

## 2013-12-21 ENCOUNTER — Ambulatory Visit (INDEPENDENT_AMBULATORY_CARE_PROVIDER_SITE_OTHER): Payer: BC Managed Care – PPO | Admitting: *Deleted

## 2013-12-21 VITALS — BP 132/60 | HR 68

## 2013-12-21 DIAGNOSIS — I480 Paroxysmal atrial fibrillation: Secondary | ICD-10-CM

## 2013-12-21 DIAGNOSIS — I4891 Unspecified atrial fibrillation: Secondary | ICD-10-CM

## 2013-12-21 NOTE — Telephone Encounter (Signed)
Pt called the answering service c/o feeling poorly. She has not felt well since she was hospitalized on 3/21. She c/o intermittent palpitations, intermittent chest tightness (since 3/21), feeling tired and malaise. She is on flecainide. She was wondering if this is normal. Initially we discussed calling the office when it opens for an appt to be seen today. Then she said she actually feels worse now than when she initially presented to the hospital at which time I told her I felt the safest thing to do was to present back to the ER for evaluation. She has not yet had any eval for CAD but ETT was planned. She verbalized understanding and gratitude.  Dayna Dunn PA-C

## 2013-12-21 NOTE — Progress Notes (Signed)
1.) Reason for visit: walk-in not feeling well  2.) Name of MD requesting visit: hochrein  3) H&P: pt recently hosp with atrial fib. Flecainide and metoprolol started. She called the on call pa last night and was told to return to the ER. The pt had a monitor to return today and decided to ask to be seen.   4. ) ROS related to problem: She reports she can feel her heart pounding occ esp at night. She is quite anxious and is taking her xanax regularly.   5.) Assessment and plan per MD: discussed with dr Ladona Ridgeltaylor (DOD), pt will cont with the present medication. She has a follow up appt with dr hochrein in may but will make a sooner appt for her to see dr hochrein.  6.) Provider sign-of(MD statement): agree with plan. GT

## 2013-12-21 NOTE — Patient Instructions (Signed)
CONTINUE ON THE SAME MEDICINE.  FO[facility] AS [facility]EDULED

## 2013-12-26 ENCOUNTER — Telehealth (HOSPITAL_COMMUNITY): Payer: Self-pay

## 2013-12-27 ENCOUNTER — Telehealth (HOSPITAL_COMMUNITY): Payer: Self-pay

## 2013-12-28 ENCOUNTER — Ambulatory Visit (HOSPITAL_COMMUNITY)
Admission: RE | Admit: 2013-12-28 | Discharge: 2013-12-28 | Disposition: A | Discharge status: Home / Self Care | Payer: BC Managed Care – PPO | Source: Ambulatory Visit | Attending: Cardiovascular Disease | Admitting: Cardiovascular Disease

## 2013-12-28 DIAGNOSIS — I4891 Unspecified atrial fibrillation: Secondary | ICD-10-CM

## 2013-12-28 DIAGNOSIS — I480 Paroxysmal atrial fibrillation: Secondary | ICD-10-CM

## 2013-12-29 NOTE — Telephone Encounter (Signed)
New message    Patient calling spoken with Amber Shelton on yesterday has some questions regarding what was told to her on phone verse her paperwork.

## 2014-01-02 ENCOUNTER — Telehealth: Payer: Self-pay | Admitting: *Deleted

## 2014-01-02 ENCOUNTER — Telehealth (HOSPITAL_COMMUNITY): Payer: Self-pay | Admitting: *Deleted

## 2014-01-02 DIAGNOSIS — R9439 Abnormal result of other cardiovascular function study: Secondary | ICD-10-CM

## 2014-01-02 NOTE — Telephone Encounter (Signed)
Pt aware of results of GXT and [facility]  She is also aware of the results of her monitor that demonstrated NSR atrial ectopy with shorts runs of atrial tachycardia.

## 2014-01-02 NOTE — Telephone Encounter (Signed)
Impression: Early positive GXT with the patient exercising only to a 4.9 met workload and developing up to 2.5 mm of horizontal ST depression at a maximal HR of 146, representing 91% APMHR. Confirmed by KELLY MD, THOMAS ([id number]) on 12/28/2013 8:11:46 PM  Needs a Lexiscan Myoview before her appt on Tuesday per Dr Hochrein.   

## 2014-01-03 ENCOUNTER — Telehealth (HOSPITAL_COMMUNITY): Payer: Self-pay

## 2014-01-03 NOTE — Telephone Encounter (Signed)
appt scheduled for 4/17

## 2014-01-05 ENCOUNTER — Ambulatory Visit (HOSPITAL_COMMUNITY)
Admission: RE | Admit: 2014-01-05 | Discharge: 2014-01-05 | Disposition: A | Discharge status: Home / Self Care | Payer: BC Managed Care – PPO | Source: Ambulatory Visit | Attending: Cardiovascular Disease | Admitting: Cardiovascular Disease

## 2014-01-05 DIAGNOSIS — R9439 Abnormal result of other cardiovascular function study: Secondary | ICD-10-CM | POA: Insufficient documentation

## 2014-01-05 MED ORDER — AMINOPHYLLINE 25 MG/ML IV SOLN
75.0000 mg | Freq: Once | INTRAVENOUS | Status: AC
  Administered 2014-01-05: 75 mg via INTRAVENOUS

## 2014-01-05 MED ORDER — TECHNETIUM TC 99M SESTAMIBI GENERIC - CARDIOLITE
10.5000 | Freq: Once | INTRAVENOUS | Status: AC | PRN
  Administered 2014-01-05: 11 via INTRAVENOUS

## 2014-01-05 MED ORDER — REGADENOSON 0.4 MG/5ML IV SOLN
0.4000 mg | Freq: Once | INTRAVENOUS | Status: AC
  Administered 2014-01-05: 0.4 mg via INTRAVENOUS

## 2014-01-05 MED ORDER — TECHNETIUM TC 99M SESTAMIBI GENERIC - CARDIOLITE
29.2000 | Freq: Once | INTRAVENOUS | Status: AC | PRN
  Administered 2014-01-05: 29.2 via INTRAVENOUS

## 2014-01-05 NOTE — Procedures (Addendum)
[facility] [address] [place] [address] [phone]  Cardiology Nuclear Med Study  Amber Shelton is a 63 y.o. female     MRN : [id number]     DOB: 03-Jul-1951  Procedure Date: 01/05/2014  Nuclear Med Background Indication for Stress Test:  Evaluation for Ischemia and Post Hospital History:  PAF;Dysrhytmia;No prior NUC study for comparrison;ECHO on 12/11/2013-normal;EF=55% Cardiac Risk Factors: Family History - CAD  Symptoms:  DOE, Light-Headedness, Near Syncope and Palpitations   Nuclear Pre-Procedure Caffeine/Decaff Intake:  7:00pm NPO After: 5:00am   IV Site: R Forearm  IV 0.9% NS with Angio Cath:  22g  Chest Size (in):  n/a IV Started by: Berdie OgrenAmanda Wease, RN  Height: 5\' 2"  (1.575 m)  Cup Size: B  BMI:  Body mass index is 24.32 kg/(m^2). Weight:  133 lb (60.328 kg)   Tech Comments:  n/a    Nuclear Med Study 1 or 2 day study: 1 day  Stress Test Type:  Lexiscan  Order Authorizing Provider:  Rollene RotundaJames Hochrein, MD   Resting Radionuclide: Technetium 7916m Sestamibi  Resting Radionuclide Dose: 10.5 mCi   Stress Radionuclide:  Technetium 7016m Sestamibi  Stress Radionuclide Dose: 29.2 mCi           Stress Protocol Rest HR: 67 Stress HR: 99  Rest BP: 187/103 Stress BP: 198/93  Exercise Time (min): n/a METS: n/a   Predicted Max HR: 159 bpm % Max HR: 62.26 bpm Rate Pressure Product: [id number]  Dose of Adenosine (mg):  n/a Dose of Lexiscan: 0.4 mg  Dose of Atropine (mg): n/a Dose of Dobutamine: n/a mcg/kg/min (at max HR)  Stress Test Technologist: Esperanza Sheetserry-Marie Martin, CCT Nuclear Technologist: Gonzella LexPam Phillips, CNMT   Rest Procedure:  Myocardial perfusion imaging was performed at rest 45 minutes following the intravenous administration of Technetium 1316m Sestamibi. Stress Procedure:  The patient received IV Lexiscan 0.4 mg over 15-seconds.  Technetium 3916m Sestamibi injected IV at 30-seconds. Patient experienced SOB and 75 mg of  Aminophylline IV was administered at 5 minutes. There were no significant changes with Lexiscan.  Quantitative spect images were obtained after a 45 minute delay.  Transient Ischemic Dilatation (Normal <1.22):  0.99 Lung/Heart Ratio (Normal <0.45):  0.27 QGS EDV:  87 ml QGS ESV:  38 ml LV Ejection Fraction: 56%      Rest ECG: NSR-LVH  Stress ECG: No significant change from baseline ECG  QPS Raw Data Images:  Mild breast attenuation.  Normal left ventricular size. Stress Images:  Normal homogeneous uptake in all areas of the myocardium. Rest Images:  Normal homogeneous uptake in all areas of the myocardium. Subtraction (SDS):  No evidence of ischemia. LV Wall Motion:  NL LV Function; NL Wall Motion  Impression Exercise Capacity:  Lexiscan with no exercise. BP Response:  Hypertensive blood pressure response. Clinical Symptoms:  No significant symptoms noted. ECG Impression:  No significant ECG changes with Lexiscan. Comparison with Prior Nuclear Study: No previous nuclear study performed   Overall Impression:  Normal stress nuclear study. and Low risk stress nuclear study with mild breast attenuation artifact.[name].   [name], MD  01/05/2014 12:41 PM

## 2014-01-08 ENCOUNTER — Other Ambulatory Visit: Payer: BC Managed Care – PPO

## 2014-01-08 ENCOUNTER — Ambulatory Visit (INDEPENDENT_AMBULATORY_CARE_PROVIDER_SITE_OTHER): Payer: BC Managed Care – PPO | Admitting: Internal Medicine

## 2014-01-08 ENCOUNTER — Encounter: Payer: Self-pay | Admitting: Internal Medicine

## 2014-01-08 VITALS — BP 118/70 | HR 66 | Ht 63.0 in | Wt 132.4 lb

## 2014-01-08 DIAGNOSIS — I4891 Unspecified atrial fibrillation: Secondary | ICD-10-CM

## 2014-01-08 DIAGNOSIS — J302 Other seasonal allergic rhinitis: Secondary | ICD-10-CM

## 2014-01-08 DIAGNOSIS — J309 Allergic rhinitis, unspecified: Secondary | ICD-10-CM

## 2014-01-08 DIAGNOSIS — I480 Paroxysmal atrial fibrillation: Secondary | ICD-10-CM

## 2014-01-08 DIAGNOSIS — Z91018 Allergy to other foods: Secondary | ICD-10-CM

## 2014-01-08 DIAGNOSIS — J3089 Other allergic rhinitis: Secondary | ICD-10-CM

## 2014-01-08 DIAGNOSIS — G4733 Obstructive sleep apnea (adult) (pediatric): Secondary | ICD-10-CM

## 2014-01-08 NOTE — Patient Instructions (Signed)
Order- schedule split protocol NPSG    Dx OSA  Order- lab- Allergy profile, Food IgE profile, Nut panel   Dx allergic rhinitis, food allergy  Try using otc Flonase nasal spray for spring pollen allergy      1-2 puffs each nostril once daily at bedtime

## 2014-01-08 NOTE — Assessment & Plan Note (Signed)
Appears to be in RSR today

## 2014-01-08 NOTE — Assessment & Plan Note (Signed)
Plan- Flonase. Lab- Allergy profile

## 2014-01-08 NOTE — Assessment & Plan Note (Signed)
Discussed association of OSA with AFib. Plan - update with new NPSG to reassess.
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
## 2014-01-09 ENCOUNTER — Encounter: Payer: Self-pay | Admitting: Cardiology

## 2014-01-09 ENCOUNTER — Ambulatory Visit (INDEPENDENT_AMBULATORY_CARE_PROVIDER_SITE_OTHER): Payer: BC Managed Care – PPO | Admitting: Cardiology

## 2014-01-09 VITALS — BP 136/70 | HR 60 | Ht 63.0 in | Wt 130.0 lb

## 2014-01-09 DIAGNOSIS — Z79899 Other long term (current) drug therapy: Secondary | ICD-10-CM

## 2014-01-09 DIAGNOSIS — I4891 Unspecified atrial fibrillation: Secondary | ICD-10-CM

## 2014-01-09 LAB — NUT MIX PROFILE
ALMONDS: 0.28 kU/L — AB
Brazil Nut: <0.1 kU/L
Cashew IgE: <0.1 kU/L
Hazelnut: 2.86 kU/L — ABNORMAL HIGH
PEANUT IGE: 0.27 kU/L — AB
Pecan Nut: <0.1 kU/L
Pistachio  IgE: <0.1 kU/L
Walnut: <0.1 kU/L

## 2014-01-09 LAB — ALLERGY FULL PROFILE
ALLERGEN, D PTERNOYSSINUS, D1: 1.52 kU/L — AB
Allergen,Goose feathers, e70: <0.1 kU/L
Alternaria Alternata: <0.1 kU/L
Aspergillus fumigatus, m3: <0.1 kU/L
BAHIA GRASS: 3.89 kU/L — AB
BOX ELDER: 0.43 kU/L — AB
Bermuda Grass: 3.75 kU/L — ABNORMAL HIGH
Candida Albicans: <0.1 kU/L
Cat Dander: 0.92 kU/L — ABNORMAL HIGH
Common Ragweed: 2.29 kU/L — ABNORMAL HIGH
D. farinae: 1.94 kU/L — ABNORMAL HIGH
Dog Dander: 1.03 kU/L — ABNORMAL HIGH
Elm IgE: 1.51 kU/L — ABNORMAL HIGH
Fescue: 4.58 kU/L — ABNORMAL HIGH
G005 RYE, PERENNIAL: 4 kU/L — AB
G009 Red Top: 4.82 kU/L — ABNORMAL HIGH
GOLDENROD: 0.29 kU/L — AB
HOUSE DUST HOLLISTER: 0.95 kU/L — AB
Lamb's Quarters: 0.14 kU/L — ABNORMAL HIGH
Oak: 4.21 kU/L — ABNORMAL HIGH
Plantain: 0.31 kU/L — ABNORMAL HIGH
SYCAMORE TREE: 0.13 kU/L — AB
TIMOTHY GRASS: 3.5 kU/L — AB

## 2014-01-09 LAB — ALLERGEN FOOD PROFILE SPECIFIC IGE
Apple: 2.1 kU/L — ABNORMAL HIGH
Corn: <0.1 kU/L
IgE (Immunoglobulin E), Serum: 63.6 IU/mL (ref 0.0–180.0)
Milk IgE: <0.1 kU/L
Orange: <0.1 kU/L
PEANUT IGE: 0.27 kU/L — AB
Soybean IgE: <0.1 kU/L
Tomato IgE: 0.11 kU/L — ABNORMAL HIGH
Tuna IgE: <0.1 kU/L
WHEAT IGE: 0.22 kU/L — AB

## 2014-01-09 NOTE — Patient Instructions (Signed)
The current medical regimen is effective;  continue present plan and medications.  Please have a Flecainide level drawn.  Do not take your morning dose of this medication the morning of your lab work.  [facility].  Follow up in 6 months with Dr Antoine PocheHochrein.  You will receive a letter in the mail 2 months before you are due.  Please call us when you receive this letter to schedule your follow up appointment.
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
## 2014-01-11 ENCOUNTER — Encounter: Payer: Self-pay | Admitting: Internal Medicine

## 2014-01-12 ENCOUNTER — Ambulatory Visit (INDEPENDENT_AMBULATORY_CARE_PROVIDER_SITE_OTHER): Payer: BC Managed Care – PPO | Admitting: Internal Medicine

## 2014-01-12 ENCOUNTER — Encounter: Payer: Self-pay | Admitting: Internal Medicine

## 2014-01-12 VITALS — BP 148/80 | HR 76 | Temp 97.8°F | Resp 16 | Wt 131.0 lb

## 2014-01-12 DIAGNOSIS — R5383 Other fatigue: Secondary | ICD-10-CM

## 2014-01-12 DIAGNOSIS — I480 Paroxysmal atrial fibrillation: Secondary | ICD-10-CM

## 2014-01-12 DIAGNOSIS — Z733 Stress, not elsewhere classified: Secondary | ICD-10-CM

## 2014-01-12 DIAGNOSIS — F439 Reaction to severe stress, unspecified: Secondary | ICD-10-CM

## 2014-01-12 DIAGNOSIS — I4891 Unspecified atrial fibrillation: Secondary | ICD-10-CM

## 2014-01-12 DIAGNOSIS — R5381 Other malaise: Secondary | ICD-10-CM

## 2014-01-12 MED ORDER — VITAMIN D 1000 UNITS PO TABS: 1000.0000 [IU] | ORAL_TABLET | Freq: Every day | ORAL | Status: AC

## 2014-01-12 NOTE — Progress Notes (Signed)
Pre visit review using our clinic review tool, if applicable. No additional management support is needed unless otherwise documented below in the visit note. 

## 2014-01-12 NOTE — Patient Instructions (Signed)
Try Flecainide 1/2 twice a day Take metoprolol 1/2 twice a day

## 2014-01-12 NOTE — Progress Notes (Signed)
   Subjective:    HPI  C/o feeling bad since she started to take A fib meds a month ago: anxious, tired F/u palpitations. C/o stress  Wt Readings from Last 3 Encounters:  01/12/14 131 lb (59.421 kg)  01/09/14 130 lb (58.968 kg)  01/08/14 132 lb 6.4 oz (60.056 kg)   BP Readings from Last 3 Encounters:  01/12/14 148/80  01/09/14 136/70  01/08/14 118/70      Review of Systems  Constitutional: Positive for fatigue. Negative for chills, activity change, appetite change and unexpected weight change.  HENT: Negative for congestion, mouth sores, sinus pressure and voice change.   Eyes: Negative for visual disturbance.  Respiratory: Negative for cough, chest tightness and wheezing.   Cardiovascular: Negative for chest pain, palpitations and leg swelling.  Gastrointestinal: Negative for nausea, vomiting and abdominal pain.  Genitourinary: Negative for frequency, difficulty urinating and vaginal pain.  Musculoskeletal: Negative for back pain and gait problem.  Skin: Negative for pallor, rash and wound.  Neurological: Negative for dizziness, tremors, syncope, weakness, numbness and headaches.  Psychiatric/Behavioral: Positive for sleep disturbance and decreased concentration. Negative for suicidal ideas and confusion. The patient is nervous/anxious.        Objective:   Physical Exam  Constitutional: She appears well-developed. No distress.  HENT:  Head: Normocephalic.  Right Ear: External ear normal.  Left Ear: External ear normal.  Nose: Nose normal.  Mouth/Throat: Oropharynx is clear and moist.  Eyes: Conjunctivae are normal. Pupils are equal, round, and reactive to light. Right eye exhibits no discharge. Left eye exhibits no discharge.  Neck: Normal range of motion. Neck supple. No JVD present. No tracheal deviation present. No thyromegaly present.  Cardiovascular: Normal rate, regular rhythm and normal heart sounds.   Pulmonary/Chest: No stridor. No respiratory distress. She  has no wheezes.  Abdominal: Soft. Bowel sounds are normal. She exhibits no distension and no mass. There is no tenderness. There is no rebound and no guarding.  Musculoskeletal: She exhibits no edema and no tenderness.  Lymphadenopathy:    She has no cervical adenopathy.  Neurological: She displays normal reflexes. No cranial nerve deficit. She exhibits normal muscle tone. Coordination normal.  Skin: No rash noted. No erythema.  Psychiatric: She has a normal mood and affect. Her behavior is normal. Judgment and thought content normal.     Lab Results  Component Value Date   WBC 10.3 12/10/2013   HGB 13.4 12/10/2013   HCT 38.8 12/10/2013   PLT 240 12/10/2013   GLUCOSE 108* 12/10/2013   CHOL 175 10/07/2011   TRIG 85.0 10/07/2011   HDL 58.70 10/07/2011   LDLDIRECT 120.1 07/22/2010   LDLCALC 99 10/07/2011   ALT 16 10/07/2011   AST 25 10/07/2011   NA 137 12/10/2013   K 4.0 12/10/2013   CL 100 12/10/2013   CREATININE 0.84 12/10/2013   BUN 11 12/10/2013   CO2 24 12/10/2013   TSH 2.749 12/10/2013   HGBA1C 5.4 12/10/2013        Assessment & Plan:

## 2014-01-14 DIAGNOSIS — R5383 Other fatigue: Secondary | ICD-10-CM | POA: Insufficient documentation

## 2014-01-14 NOTE — Assessment & Plan Note (Signed)
4/15 side effects w/meds Try Flecainide 1/2 twice a day Take metoprolol 1/2 twice a day

## 2014-01-14 NOTE — Assessment & Plan Note (Signed)
Discussed.

## 2014-01-14 NOTE — Assessment & Plan Note (Signed)
4/15 side effects w/meds Try Flecainide 1/2 twice a day Take metoprolol 1/2 twice a day 

## 2014-01-15 ENCOUNTER — Other Ambulatory Visit: Payer: Self-pay | Admitting: *Deleted

## 2014-01-15 NOTE — Telephone Encounter (Signed)
Rf req for Alprazolam 0.5 mg . 90 day supply. Ok to Rf?

## 2014-01-16 ENCOUNTER — Other Ambulatory Visit: Payer: BC Managed Care – PPO

## 2014-01-16 DIAGNOSIS — I480 Paroxysmal atrial fibrillation: Secondary | ICD-10-CM

## 2014-01-16 NOTE — Telephone Encounter (Signed)
OK to fill this prescription with additional refills x1 Thank you!  

## 2014-01-17 MED ORDER — ALPRAZOLAM 0.5 MG PO TABS: 0.2500 mg | ORAL_TABLET | Freq: Three times a day (TID) | ORAL | Status: DC | PRN

## 2014-01-17 NOTE — Addendum Note (Signed)
Addended by: Deatra JamesBRAND, LUCY M on: 01/17/2014 08:51 AM   Modules accepted: Orders

## 2014-01-17 NOTE — Telephone Encounter (Signed)
[facility]...Raechel Chute/lmb

## 2014-01-21 LAB — FLECAINIDE LEVEL: FLECAINIDE: 0.32 ug/mL (ref 0.20–1.00)

## 2014-01-23 ENCOUNTER — Telehealth: Payer: Self-pay | Admitting: *Deleted

## 2014-01-23 NOTE — Telephone Encounter (Signed)
Spoke with patient about Flecainide level which is normal.  Pt reports that she had been having side effects from flecainide and saw her PCP who decreased the dose to 1/2 tablet BID.  She did that for 3 days but still had some chest tightness so she increased it to 1 tablet in the AM and 1/2 tablet in the PM.  She is now feeling much better and has no more side effects.  She will continue this dosage and call back if any further issues.

## 2014-01-27 ENCOUNTER — Telehealth: Payer: Self-pay | Admitting: Cardiology

## 2014-01-27 NOTE — Telephone Encounter (Signed)
Ms. Roselee Novaltman called answering service with concern regarding atrial fibrillation. She has known paroxysmal atrial fibrillation. Recent negative stress test. Normal LVEF. She takes flecainide. She is not on anticoagulation as she is felt to be at low risk for thromboembolism. This morning while taking her BP using her home cuff she noticed that her pulse was irregular. She felt like she was in atrial fibrillation. She denies racing palpitations and reports her pulse was in the 60s but irregular. She denies CP or SOB. She denies dizziness, near syncope or syncope. She reports compliance with her medications. She states, "I'm just calling to find out what I should do. Can I wait it out?"   I explained that I cannot know if she is definitely in AFib or not without an ECG. However, if she were in AFib, her heart rate is controlled and she is minimally symptomatic, hemodynamically stable. I provided reassurance and explained she will most likely convert to SR without intervention. She was instructed to report any symptoms of racing palpitations (or elevated HR on BP monitor at home), CP, SOB, dizziness or syncope. I instructed her to proceed to ED if she developed symptoms or persistent tachycardia. She will continue her current medication regimen. I scheduled follow-up with Dr. Antoine PocheHochrein on Thursday, 01/29/2014. She expressed verbal understanding and agrees with this plan of care.

## 2014-02-01 ENCOUNTER — Ambulatory Visit (INDEPENDENT_AMBULATORY_CARE_PROVIDER_SITE_OTHER): Payer: BC Managed Care – PPO | Admitting: Cardiology

## 2014-02-01 ENCOUNTER — Encounter: Payer: Self-pay | Admitting: Cardiology

## 2014-02-01 VITALS — BP 118/70 | HR 78 | Ht 63.0 in | Wt 131.0 lb

## 2014-02-01 DIAGNOSIS — R0602 Shortness of breath: Secondary | ICD-10-CM

## 2014-02-01 DIAGNOSIS — I4891 Unspecified atrial fibrillation: Secondary | ICD-10-CM

## 2014-02-01 DIAGNOSIS — I480 Paroxysmal atrial fibrillation: Secondary | ICD-10-CM

## 2014-02-01 MED ORDER — FLECAINIDE ACETATE 50 MG PO TABS: 25.0000 mg | ORAL_TABLET | Freq: Two times a day (BID) | ORAL | Status: DC

## 2014-02-01 NOTE — Patient Instructions (Addendum)
Please decrease your Flecainide to 25 mg twice a day. Continue all other medications as listed.  Follow up in 6 months with Dr Antoine PocheHochrein.  You will receive a letter in the mail 2 months before you are due.  Please call us when you receive this letter to schedule your follow up appointment.
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
## 2014-02-09 ENCOUNTER — Encounter: Payer: Self-pay | Admitting: Internal Medicine

## 2014-02-09 ENCOUNTER — Ambulatory Visit (INDEPENDENT_AMBULATORY_CARE_PROVIDER_SITE_OTHER): Payer: BC Managed Care – PPO | Admitting: Internal Medicine

## 2014-02-09 VITALS — BP 108/70 | HR 76 | Temp 98.2°F | Resp 16 | Wt 130.0 lb

## 2014-02-09 DIAGNOSIS — R5383 Other fatigue: Secondary | ICD-10-CM

## 2014-02-09 DIAGNOSIS — J3089 Other allergic rhinitis: Secondary | ICD-10-CM

## 2014-02-09 DIAGNOSIS — F439 Reaction to severe stress, unspecified: Secondary | ICD-10-CM

## 2014-02-09 DIAGNOSIS — Z733 Stress, not elsewhere classified: Secondary | ICD-10-CM

## 2014-02-09 DIAGNOSIS — R5381 Other malaise: Secondary | ICD-10-CM

## 2014-02-09 DIAGNOSIS — I480 Paroxysmal atrial fibrillation: Secondary | ICD-10-CM

## 2014-02-09 DIAGNOSIS — J309 Allergic rhinitis, unspecified: Secondary | ICD-10-CM

## 2014-02-09 DIAGNOSIS — I4891 Unspecified atrial fibrillation: Secondary | ICD-10-CM

## 2014-02-09 DIAGNOSIS — J302 Other seasonal allergic rhinitis: Secondary | ICD-10-CM

## 2014-02-09 NOTE — Progress Notes (Signed)
Pre visit review using our clinic review tool, if applicable. No additional management support is needed unless otherwise documented below in the visit note. 

## 2014-02-09 NOTE — Progress Notes (Signed)
   Subjective:    HPI  F/u feeling bad since she started to take A fib meds a month ago: anxious, tired F/u palpitations. C/o stress - better w/meds adjustment  Wt Readings from Last 3 Encounters:  02/09/14 130 lb (58.968 kg)  02/01/14 131 lb (59.421 kg)  01/12/14 131 lb (59.421 kg)   BP Readings from Last 3 Encounters:  02/09/14 108/70  02/01/14 118/70  01/12/14 148/80      Review of Systems  Constitutional: Positive for fatigue. Negative for chills, activity change, appetite change and unexpected weight change.  HENT: Negative for congestion, mouth sores, sinus pressure and voice change.   Eyes: Negative for visual disturbance.  Respiratory: Negative for cough, chest tightness and wheezing.   Cardiovascular: Negative for chest pain, palpitations and leg swelling.  Gastrointestinal: Negative for nausea, vomiting and abdominal pain.  Genitourinary: Negative for frequency, difficulty urinating and vaginal pain.  Musculoskeletal: Negative for back pain and gait problem.  Skin: Negative for pallor, rash and wound.  Neurological: Negative for dizziness, tremors, syncope, weakness, numbness and headaches.  Psychiatric/Behavioral: Positive for sleep disturbance and decreased concentration. Negative for suicidal ideas and confusion. The patient is nervous/anxious.        Objective:   Physical Exam  Constitutional: She appears well-developed. No distress.  HENT:  Head: Normocephalic.  Right Ear: External ear normal.  Left Ear: External ear normal.  Nose: Nose normal.  Mouth/Throat: Oropharynx is clear and moist.  Eyes: Conjunctivae are normal. Pupils are equal, round, and reactive to light. Right eye exhibits no discharge. Left eye exhibits no discharge.  Neck: Normal range of motion. Neck supple. No JVD present. No tracheal deviation present. No thyromegaly present.  Cardiovascular: Normal rate, regular rhythm and normal heart sounds.   Pulmonary/Chest: No stridor. No  respiratory distress. She has no wheezes.  Abdominal: Soft. Bowel sounds are normal. She exhibits no distension and no mass. There is no tenderness. There is no rebound and no guarding.  Musculoskeletal: She exhibits no edema and no tenderness.  Lymphadenopathy:    She has no cervical adenopathy.  Neurological: She displays normal reflexes. No cranial nerve deficit. She exhibits normal muscle tone. Coordination normal.  Skin: No rash noted. No erythema.  Psychiatric: She has a normal mood and affect. Her behavior is normal. Judgment and thought content normal.     Lab Results  Component Value Date   WBC 10.3 12/10/2013   HGB 13.4 12/10/2013   HCT 38.8 12/10/2013   PLT 240 12/10/2013   GLUCOSE 108* 12/10/2013   CHOL 175 10/07/2011   TRIG 85.0 10/07/2011   HDL 58.70 10/07/2011   LDLDIRECT 120.1 07/22/2010   LDLCALC 99 10/07/2011   ALT 16 10/07/2011   AST 25 10/07/2011   NA 137 12/10/2013   K 4.0 12/10/2013   CL 100 12/10/2013   CREATININE 0.84 12/10/2013   BUN 11 12/10/2013   CO2 24 12/10/2013   TSH 2.749 12/10/2013   HGBA1C 5.4 12/10/2013        Assessment & Plan:

## 2014-02-09 NOTE — Assessment & Plan Note (Signed)
Continue with current prescription therapy as reflected on the Med list.  

## 2014-02-12 NOTE — Assessment & Plan Note (Signed)
Continue with current prescription therapy as reflected on the Med list.  

## 2014-02-12 NOTE — Assessment & Plan Note (Signed)
Continue with current prescription prn therapy as reflected on the Med list.  

## 2014-02-15 ENCOUNTER — Ambulatory Visit (HOSPITAL_BASED_OUTPATIENT_CLINIC_OR_DEPARTMENT_OTHER): Payer: BC Managed Care – PPO | Attending: Internal Medicine

## 2014-02-15 ENCOUNTER — Ambulatory Visit: Payer: BC Managed Care – PPO | Admitting: Cardiology

## 2014-02-15 VITALS — Ht 63.0 in | Wt 130.0 lb

## 2014-02-15 DIAGNOSIS — G4700 Insomnia, unspecified: Secondary | ICD-10-CM | POA: Insufficient documentation

## 2014-02-15 DIAGNOSIS — G4730 Sleep apnea, unspecified: Principal | ICD-10-CM

## 2014-02-15 DIAGNOSIS — G4733 Obstructive sleep apnea (adult) (pediatric): Secondary | ICD-10-CM

## 2014-02-18 DIAGNOSIS — G4730 Sleep apnea, unspecified: Secondary | ICD-10-CM

## 2014-02-18 DIAGNOSIS — G4700 Insomnia, unspecified: Secondary | ICD-10-CM

## 2014-02-18 NOTE — Sleep Study (Signed)
   NAME: Amber Shelton DATE OF BIRTH:  06/04/1951 MEDICAL RECORD NUMBER [id number]  LOCATION: [facility]  PHYSICIAN: Clinton D Young  DATE OF STUDY: 02/15/2014  SLEEP STUDY TYPE: Nocturnal Polysomnogram               REFERRING PHYSICIAN: Jetty Duhamel D, MD  INDICATION FOR STUDY: Insomnia with sleep apnea  EPWORTH SLEEPINESS SCORE:   9/24 HEIGHT: 5\' 3"  (160 cm)  WEIGHT: 130 lb (58.968 kg)    Body mass index is 23.03 kg/(m^2).  NECK SIZE: 12 in.  MEDICATIONS: Charted for review  SLEEP ARCHITECTURE: Total sleep time 60.5 minutes with sleep efficiency 14.5%. Stage I was 14%, stage II 86%, stage III and REM were absent. Sleep latency 38 minutes, awake after sleep onset 219.5 minutes, arousal index 14.9, bedtime medication: Xanax  RESPIRATORY DATA: Apnea hypopneas index (AHI) 2.0 per hour. 2 total events scored, both as hypopneas while nonsupine. She did not qualify for split-night protocol CPAP titration.  OXYGEN DATA: Mild snoring with oxygen desaturation to a nadir of 90% and mean oxygen saturation through the study of 93.6% on room air.  CARDIAC DATA: Sinus rhythm with PACs and PVCs  MOVEMENT/PARASOMNIA: No significant movement disturbance, bathroom x1  IMPRESSION/ RECOMMENDATION:   1) Significant difficulty initiating and maintaining sleep with very limited total sleep time and the absence of stages 3 and REM. Xanax was taken at bedtime. 2) Occasional respiratory event with sleep disturbance, within normal limits. AHI 2.0 per hour, representing 2 hypopneas. The normal range for adults as an AHI of 0-5 events per hour). Mild snoring with oxygen desaturation to a nadir of 90% and mean oxygen saturation through the study of 93.6% on room air.   Signed Jetty Duhamel M.D. Waymon Budge Diplomate, Biomedical engineer of Sleep Medicine  ELECTRONICALLY SIGNED ON:  02/18/2014, 12:20 PM [facility] PH: [phone]   FX: [phone] ACCREDITED BY THE [employer] OF SLEEP MEDICINE

## 2014-02-19 ENCOUNTER — Encounter: Payer: Self-pay | Admitting: Internal Medicine

## 2014-02-19 ENCOUNTER — Ambulatory Visit (INDEPENDENT_AMBULATORY_CARE_PROVIDER_SITE_OTHER): Payer: BC Managed Care – PPO | Admitting: Internal Medicine

## 2014-02-19 VITALS — BP 100/66 | HR 69 | Ht 63.0 in | Wt 132.4 lb

## 2014-02-19 DIAGNOSIS — I480 Paroxysmal atrial fibrillation: Secondary | ICD-10-CM

## 2014-02-19 DIAGNOSIS — I4891 Unspecified atrial fibrillation: Secondary | ICD-10-CM

## 2014-02-19 DIAGNOSIS — R0683 Snoring: Secondary | ICD-10-CM

## 2014-02-19 DIAGNOSIS — R21 Rash and other nonspecific skin eruption: Secondary | ICD-10-CM

## 2014-02-19 DIAGNOSIS — R0609 Other forms of dyspnea: Secondary | ICD-10-CM

## 2014-02-19 DIAGNOSIS — R0989 Other specified symptoms and signs involving the circulatory and respiratory systems: Secondary | ICD-10-CM

## 2014-02-19 NOTE — Patient Instructions (Signed)
Please call and come back as needed

## 2014-02-19 NOTE — Progress Notes (Signed)
01/08/14- 62 yoF never smoker allergy Self referral from our records-also needs work up for sleep-per Cardiology MD. Amber GrimesWakes up gasping for air and told she snores. Was originally to see us for allergy but in the interim was hosp w/ Afib and told she should see us for possible OSA.  Normal nuclear stress test. Old allergy patient with skin testing at old office many years ago. Had been having increased nasal congestion, sinus pressure, ear pressure this Spring. Using benadryl. Claritin had helped but she felt panicked and suspected it interfered with her xanax. Has also developed food allergy- nuts, oranges/ Tang make lips swell, tongue burn, itch. No airway problem, hives or asthma. No unusual reaction to insect stings.  Husband has been telling her that she stops breathing, snores loudly. Remote sleep study showed AHI 7/ hr by her report- no CPAP then.  Works as Interior and spatial designerhairdresser.  02/19/14- 62 yoF never smoker allergy- Self referral, also needs work up for sleep-per Cardiology MD. . 4 wk follow up.  Waking up feeling SOB has improved with heart medication.  Has red rash under bilateral arms x 6 wks. New rash under arms x6 weeks treated with Neosporin, then Desonex and now coconut oil. Cardiology reduced dose of flecainide and her side effects went away. NPSG 02/15/14- Neg for OSA, AHI 2/ hr, weight 130 pounds. Difficulty falling asleep and staying asleep despite Xanax at bedtime. Mild snoring with desaturation to 90%  ROS-see HPI Constitutional:   No-   weight loss, night sweats, fevers, chills, +fatigue, lassitude. HEENT:   No-  headaches, difficulty swallowing, tooth/dental problems, sore throat,      +sneezing, +itching, ear ache, +nasal congestion, +post nasal drip,  CV:  No-   chest pain, orthopnea, PND, swelling in lower extremities, anasarca,                                                           dizziness, palpitations Resp: No-   shortness of breath with exertion or at rest.              No-    productive cough,  No non-productive cough,  No- coughing up of blood.              No-   change in color of mucus.  No- wheezing.   Skin: +rash GI:  No-   heartburn, indigestion, abdominal pain, nausea, vomiting,  GU: . MS:  No-   joint pain or swelling.   Neuro-     nothing unusual Psych:  No- change in mood or affect. No depression or anxiety.  No memory loss.  OBJ- Physical Exam General- Alert, Oriented, Affect-appropriate, Distress- none acute. Medium build Skin- +rash in axillae,  Lymphadenopathy- none Head- atraumatic            Eyes- Gross vision intact, PERRLA, conjunctivae and secretions clear            Ears- Hearing, canals-normal            Nose- Clear, no-Septal dev, mucus, polyps, erosion, perforation             Throat- Mallampati II-III, mucosa clear , drainage- none, tonsils- atrophic Neck- flexible , trachea midline, no stridor , thyroid nl, carotid no bruit Chest - symmetrical excursion , unlabored  Heart/CV- RRR- today , no murmur , no gallop  , no rub, nl s1 s2                           - JVD- none , edema- none, stasis changes- none, varices- none           Lung- clear to P&A, wheeze- none, cough- none , dullness-none, rub- none           Chest wall-  Abd-  Br/ Gen/ Rectal- Not done, not indicated Extrem- cyanosis- none, clubbing, none, atrophy- none, strength- nl Neuro- grossly intact to observation

## 2014-02-22 ENCOUNTER — Encounter: Payer: Self-pay | Admitting: Internal Medicine

## 2014-02-23 ENCOUNTER — Other Ambulatory Visit: Payer: Self-pay | Admitting: Internal Medicine

## 2014-02-23 MED ORDER — ALPRAZOLAM 0.5 MG PO TABS: 0.5000 mg | ORAL_TABLET | Freq: Three times a day (TID) | ORAL | Status: DC | PRN

## 2014-04-04 NOTE — Telephone Encounter (Signed)
Encounter complete. 

## 2014-04-05 NOTE — Telephone Encounter (Signed)
Encounter complete. 

## 2014-04-24 DIAGNOSIS — R21 Rash and other nonspecific skin eruption: Secondary | ICD-10-CM | POA: Insufficient documentation

## 2014-04-24 NOTE — Assessment & Plan Note (Signed)
Some difficulty initiating and maintaining sleep, but she does not have sleep apnea Plan-recommend weight loss, educated sleep habits

## 2014-04-24 NOTE — Assessment & Plan Note (Signed)
Probably reaction to her deodorant, with excoriation. Monilia less likely. Plan-hypoallergenic deodorant, maybe start with simple baking soda. Can try desitin

## 2014-04-24 NOTE — Assessment & Plan Note (Signed)
Breathing more comfortably after adjustment of cardiac meds

## 2014-04-27 ENCOUNTER — Encounter: Payer: Self-pay | Admitting: Internal Medicine

## 2014-04-27 ENCOUNTER — Other Ambulatory Visit (INDEPENDENT_AMBULATORY_CARE_PROVIDER_SITE_OTHER): Payer: BC Managed Care – PPO

## 2014-04-27 ENCOUNTER — Ambulatory Visit (INDEPENDENT_AMBULATORY_CARE_PROVIDER_SITE_OTHER): Payer: BC Managed Care – PPO | Admitting: Internal Medicine

## 2014-04-27 VITALS — BP 120/68 | HR 65 | Temp 97.8°F | Resp 16 | Ht 63.0 in | Wt 137.0 lb

## 2014-04-27 DIAGNOSIS — R5383 Other fatigue: Secondary | ICD-10-CM

## 2014-04-27 DIAGNOSIS — R143 Flatulence: Secondary | ICD-10-CM

## 2014-04-27 DIAGNOSIS — R3129 Other microscopic hematuria: Secondary | ICD-10-CM

## 2014-04-27 DIAGNOSIS — R5381 Other malaise: Secondary | ICD-10-CM

## 2014-04-27 DIAGNOSIS — R142 Eructation: Secondary | ICD-10-CM

## 2014-04-27 DIAGNOSIS — R141 Gas pain: Secondary | ICD-10-CM

## 2014-04-27 DIAGNOSIS — R609 Edema, unspecified: Secondary | ICD-10-CM | POA: Insufficient documentation

## 2014-04-27 DIAGNOSIS — R140 Abdominal distension (gaseous): Secondary | ICD-10-CM | POA: Insufficient documentation

## 2014-04-27 LAB — URINALYSIS, ROUTINE W REFLEX MICROSCOPIC
Bilirubin Urine: NEGATIVE
Ketones, ur: NEGATIVE
LEUKOCYTES UA: NEGATIVE
NITRITE: NEGATIVE
Specific Gravity, Urine: 1.015 (ref 1.000–1.030)
Total Protein, Urine: NEGATIVE
URINE GLUCOSE: NEGATIVE
UROBILINOGEN UA: 0.2 (ref 0.0–1.0)
pH: 6.5 (ref 5.0–8.0)

## 2014-04-27 LAB — BASIC METABOLIC PANEL
BUN: 21 mg/dL (ref 6–23)
CO2: 29 mEq/L (ref 19–32)
Calcium: 9.4 mg/dL (ref 8.4–10.5)
Chloride: 100 mEq/L (ref 96–112)
Creatinine, Ser: 0.9 mg/dL (ref 0.4–1.2)
GFR: 66.54 mL/min (ref >60.00)
Glucose, Bld: 90 mg/dL (ref 70–99)
Potassium: 4.9 mEq/L (ref 3.5–5.1)
SODIUM: 136 meq/L (ref 135–145)

## 2014-04-27 LAB — SEDIMENTATION RATE: Sed Rate: 21 mm/hr (ref 0–22)

## 2014-04-27 LAB — T4, FREE: FREE T4: 0.86 ng/dL (ref 0.60–1.60)

## 2014-04-27 LAB — TSH: TSH: 2.62 u[IU]/mL (ref 0.35–4.50)

## 2014-04-27 MED ORDER — POTASSIUM CHLORIDE ER 10 MEQ PO TBCR: 10.0000 meq | EXTENDED_RELEASE_TABLET | Freq: Every day | ORAL | Status: DC | PRN

## 2014-04-27 MED ORDER — FUROSEMIDE 20 MG PO TABS: 10.0000 mg | ORAL_TABLET | Freq: Every day | ORAL | Status: DC | PRN

## 2014-04-27 NOTE — Progress Notes (Signed)
   Subjective:    HPI  F/u feeling better since she started to take less Flecanide  F/u palpitations. C/o stress - better w/meds adjustment  C/o wt gain, bloating x 1 mo  Wt Readings from Last 3 Encounters:  04/27/14 137 lb (62.143 kg)  02/19/14 132 lb 6.4 oz (60.056 kg)  02/15/14 130 lb (58.968 kg)   BP Readings from Last 3 Encounters:  04/27/14 120/68  02/19/14 100/66  02/09/14 108/70      Review of Systems  Constitutional: Positive for fatigue. Negative for chills, activity change, appetite change and unexpected weight change.  HENT: Negative for congestion, mouth sores, sinus pressure and voice change.   Eyes: Negative for visual disturbance.  Respiratory: Negative for cough, chest tightness and wheezing.   Cardiovascular: Negative for chest pain, palpitations and leg swelling.  Gastrointestinal: Negative for nausea, vomiting and abdominal pain.  Genitourinary: Negative for frequency, difficulty urinating and vaginal pain.  Musculoskeletal: Negative for back pain and gait problem.  Skin: Negative for pallor, rash and wound.  Neurological: Negative for dizziness, tremors, syncope, weakness, numbness and headaches.  Psychiatric/Behavioral: Positive for sleep disturbance and decreased concentration. Negative for suicidal ideas and confusion. The patient is nervous/anxious.        Objective:   Physical Exam  Constitutional: She appears well-developed. No distress.  HENT:  Head: Normocephalic.  Right Ear: External ear normal.  Left Ear: External ear normal.  Nose: Nose normal.  Mouth/Throat: Oropharynx is clear and moist.  Eyes: Conjunctivae are normal. Pupils are equal, round, and reactive to light. Right eye exhibits no discharge. Left eye exhibits no discharge.  Neck: Normal range of motion. Neck supple. No JVD present. No tracheal deviation present. No thyromegaly present.  Cardiovascular: Normal rate, regular rhythm and normal heart sounds.  Exam reveals no  gallop.   No murmur heard. Pulmonary/Chest: No stridor. No respiratory distress. She has no wheezes.  Abdominal: Soft. Bowel sounds are normal. She exhibits no distension and no mass. There is no tenderness. There is no rebound and no guarding.  Musculoskeletal: She exhibits edema. She exhibits no tenderness.  Lymphadenopathy:    She has no cervical adenopathy.  Neurological: She displays normal reflexes. No cranial nerve deficit. She exhibits normal muscle tone. Coordination normal.  Skin: No rash noted. No erythema.  Psychiatric: She has a normal mood and affect. Her behavior is normal. Judgment and thought content normal.   B trace edema  Lab Results  Component Value Date   WBC 10.3 12/10/2013   HGB 13.4 12/10/2013   HCT 38.8 12/10/2013   PLT 240 12/10/2013   GLUCOSE 108* 12/10/2013   CHOL 175 10/07/2011   TRIG 85.0 10/07/2011   HDL 58.70 10/07/2011   LDLDIRECT 120.1 07/22/2010   LDLCALC 99 10/07/2011   ALT 16 10/07/2011   AST 25 10/07/2011   NA 137 12/10/2013   K 4.0 12/10/2013   CL 100 12/10/2013   CREATININE 0.84 12/10/2013   BUN 11 12/10/2013   CO2 24 12/10/2013   TSH 2.749 12/10/2013   HGBA1C 5.4 12/10/2013        Assessment & Plan:

## 2014-04-27 NOTE — Assessment & Plan Note (Signed)
Labs Reduce Metoprolol Furos/KCl prn CT abd if not well

## 2014-04-27 NOTE — Assessment & Plan Note (Signed)
Resolving Take metoprolol 1/2 twice a day

## 2014-04-27 NOTE — Assessment & Plan Note (Addendum)
Take metoprolol 1/2 twice a day Labs CT abd if not well

## 2014-04-27 NOTE — Progress Notes (Signed)
Pre visit review using our clinic review tool, if applicable. No additional management support is needed unless otherwise documented below in the visit note. 

## 2014-05-02 DIAGNOSIS — R3129 Other microscopic hematuria: Secondary | ICD-10-CM | POA: Insufficient documentation

## 2014-05-02 NOTE — Assessment & Plan Note (Signed)
8/15 worse In the view of bloating - Urol ref and abd CT

## 2014-05-07 ENCOUNTER — Encounter: Payer: Self-pay | Admitting: Internal Medicine

## 2014-05-22 ENCOUNTER — Other Ambulatory Visit (INDEPENDENT_AMBULATORY_CARE_PROVIDER_SITE_OTHER): Payer: BC Managed Care – PPO

## 2014-05-22 ENCOUNTER — Ambulatory Visit (INDEPENDENT_AMBULATORY_CARE_PROVIDER_SITE_OTHER): Payer: BC Managed Care – PPO | Admitting: Internal Medicine

## 2014-05-22 ENCOUNTER — Encounter: Payer: Self-pay | Admitting: Internal Medicine

## 2014-05-22 VITALS — BP 120/62 | HR 72 | Temp 97.8°F | Resp 16 | Wt 136.0 lb

## 2014-05-22 DIAGNOSIS — R109 Unspecified abdominal pain: Secondary | ICD-10-CM

## 2014-05-22 DIAGNOSIS — R5381 Other malaise: Secondary | ICD-10-CM

## 2014-05-22 DIAGNOSIS — R3129 Other microscopic hematuria: Secondary | ICD-10-CM

## 2014-05-22 DIAGNOSIS — Z79899 Other long term (current) drug therapy: Secondary | ICD-10-CM

## 2014-05-22 DIAGNOSIS — R5383 Other fatigue: Secondary | ICD-10-CM

## 2014-05-22 DIAGNOSIS — I4891 Unspecified atrial fibrillation: Secondary | ICD-10-CM

## 2014-05-22 LAB — URINALYSIS
Bilirubin Urine: NEGATIVE
Ketones, ur: NEGATIVE
Leukocytes, UA: NEGATIVE
NITRITE: NEGATIVE
PH: 7.5 (ref 5.0–8.0)
Specific Gravity, Urine: 1.015 (ref 1.000–1.030)
TOTAL PROTEIN, URINE-UPE24: NEGATIVE
URINE GLUCOSE: NEGATIVE
Urobilinogen, UA: 0.2 (ref 0.0–1.0)

## 2014-05-22 NOTE — Assessment & Plan Note (Signed)
Better  

## 2014-05-22 NOTE — Progress Notes (Signed)
Pre visit review using our clinic review tool, if applicable. No additional management support is needed unless otherwise documented below in the visit note. 

## 2014-05-22 NOTE — Assessment & Plan Note (Signed)
abd [place] Repeat UA Pt declined Urol ref

## 2014-05-22 NOTE — Progress Notes (Signed)
   Subjective:    HPI  F/u feeling better since she started to take less Flecanide  F/u palpitations. F/u stress - better w/meds adjustment  F/u wt gain, bloating x 1 mo  Wt Readings from Last 3 Encounters:  05/22/14 136 lb (61.689 kg)  04/27/14 137 lb (62.143 kg)  02/19/14 132 lb 6.4 oz (60.056 kg)   BP Readings from Last 3 Encounters:  05/22/14 120/62  04/27/14 120/68  02/19/14 100/66      Review of Systems  Constitutional: Positive for fatigue. Negative for chills, activity change, appetite change and unexpected weight change.  HENT: Negative for congestion, mouth sores, sinus pressure and voice change.   Eyes: Negative for visual disturbance.  Respiratory: Negative for cough, chest tightness and wheezing.   Cardiovascular: Negative for chest pain, palpitations and leg swelling.  Gastrointestinal: Negative for nausea, vomiting and abdominal pain.  Genitourinary: Negative for frequency, difficulty urinating and vaginal pain.  Musculoskeletal: Negative for back pain and gait problem.  Skin: Negative for pallor, rash and wound.  Neurological: Negative for dizziness, tremors, syncope, weakness, numbness and headaches.  Psychiatric/Behavioral: Positive for sleep disturbance and decreased concentration. Negative for suicidal ideas and confusion. The patient is nervous/anxious.        Objective:   Physical Exam  Constitutional: She appears well-developed. No distress.  HENT:  Head: Normocephalic.  Right Ear: External ear normal.  Left Ear: External ear normal.  Nose: Nose normal.  Mouth/Throat: Oropharynx is clear and moist.  Eyes: Conjunctivae are normal. Pupils are equal, round, and reactive to light. Right eye exhibits no discharge. Left eye exhibits no discharge.  Neck: Normal range of motion. Neck supple. No JVD present. No tracheal deviation present. No thyromegaly present.  Cardiovascular: Normal rate, regular rhythm and normal heart sounds.  Exam reveals no  gallop.   No murmur heard. Pulmonary/Chest: No stridor. No respiratory distress. She has no wheezes.  Abdominal: Soft. Bowel sounds are normal. She exhibits no distension and no mass. There is no tenderness. There is no rebound and no guarding.  Musculoskeletal: She exhibits edema. She exhibits no tenderness.  Lymphadenopathy:    She has no cervical adenopathy.  Neurological: She displays normal reflexes. No cranial nerve deficit. She exhibits normal muscle tone. Coordination normal.  Skin: No rash noted. No erythema.  Psychiatric: She has a normal mood and affect. Her behavior is normal. Judgment and thought content normal.   No edema  Lab Results  Component Value Date   WBC 10.3 12/10/2013   HGB 13.4 12/10/2013   HCT 38.8 12/10/2013   PLT 240 12/10/2013   GLUCOSE 90 04/27/2014   CHOL 175 10/07/2011   TRIG 85.0 10/07/2011   HDL 58.70 10/07/2011   LDLDIRECT 120.1 07/22/2010   LDLCALC 99 10/07/2011   ALT 16 10/07/2011   AST 25 10/07/2011   NA 136 04/27/2014   K 4.9 04/27/2014   CL 100 04/27/2014   CREATININE 0.9 04/27/2014   BUN 21 04/27/2014   CO2 29 04/27/2014   TSH 2.62 04/27/2014   HGBA1C 5.4 12/10/2013        Assessment & Plan:

## 2014-05-25 LAB — FLECAINIDE LEVEL: Flecainide: 0.13 ug/mL — ABNORMAL LOW (ref 0.20–1.00)

## 2014-06-12 ENCOUNTER — Ambulatory Visit
Admission: RE | Admit: 2014-06-12 | Discharge: 2014-06-12 | Disposition: A | Discharge status: Home / Self Care | Payer: BC Managed Care – PPO | Source: Ambulatory Visit | Attending: Internal Medicine | Admitting: Internal Medicine

## 2014-08-21 ENCOUNTER — Encounter: Payer: Self-pay | Admitting: Internal Medicine

## 2014-08-21 ENCOUNTER — Ambulatory Visit (INDEPENDENT_AMBULATORY_CARE_PROVIDER_SITE_OTHER): Payer: BC Managed Care – PPO | Admitting: Internal Medicine

## 2014-08-21 VITALS — BP 138/80 | HR 65 | Temp 98.3°F | Wt 137.0 lb

## 2014-08-21 DIAGNOSIS — F439 Reaction to severe stress, unspecified: Secondary | ICD-10-CM

## 2014-08-21 DIAGNOSIS — Z658 Other specified problems related to psychosocial circumstances: Secondary | ICD-10-CM

## 2014-08-21 DIAGNOSIS — M810 Age-related osteoporosis without current pathological fracture: Secondary | ICD-10-CM

## 2014-08-21 DIAGNOSIS — R312 Other microscopic hematuria: Secondary | ICD-10-CM

## 2014-08-21 DIAGNOSIS — R3129 Other microscopic hematuria: Secondary | ICD-10-CM

## 2014-08-21 DIAGNOSIS — I480 Paroxysmal atrial fibrillation: Secondary | ICD-10-CM

## 2014-08-21 MED ORDER — ALPRAZOLAM 0.5 MG PO TABS: 0.5000 mg | ORAL_TABLET | Freq: Three times a day (TID) | ORAL | Status: DC | PRN

## 2014-08-21 NOTE — Assessment & Plan Note (Signed)
Continue with current prescription therapy as reflected on the Med list. Reduce stress, work less

## 2014-08-21 NOTE — Assessment & Plan Note (Signed)
Stable Pt declined Urol consult

## 2014-08-21 NOTE — Progress Notes (Signed)
   Subjective:    HPI  F/u feeling better since she started to take less Flecanide  F/u palpitations. F/u stress - better w/meds adjustment  F/u wt gain, bloating x 1 mo  Wt Readings from Last 3 Encounters:  08/21/14 137 lb (62.143 kg)  05/22/14 136 lb (61.689 kg)  04/27/14 137 lb (62.143 kg)   BP Readings from Last 3 Encounters:  08/21/14 138/80  05/22/14 120/62  04/27/14 120/68      Review of Systems  Constitutional: Positive for fatigue. Negative for chills, activity change, appetite change and unexpected weight change.  HENT: Negative for congestion, mouth sores, sinus pressure and voice change.   Eyes: Negative for visual disturbance.  Respiratory: Negative for cough, chest tightness and wheezing.   Cardiovascular: Negative for chest pain, palpitations and leg swelling.  Gastrointestinal: Negative for nausea, vomiting and abdominal pain.  Genitourinary: Negative for frequency, difficulty urinating and vaginal pain.  Musculoskeletal: Negative for back pain and gait problem.  Skin: Negative for pallor, rash and wound.  Neurological: Negative for dizziness, tremors, syncope, weakness, numbness and headaches.  Psychiatric/Behavioral: Positive for sleep disturbance and decreased concentration. Negative for suicidal ideas and confusion. The patient is nervous/anxious.        Objective:   Physical Exam  Constitutional: She appears well-developed. No distress.  HENT:  Head: Normocephalic.  Right Ear: External ear normal.  Left Ear: External ear normal.  Nose: Nose normal.  Mouth/Throat: Oropharynx is clear and moist.  Eyes: Conjunctivae are normal. Pupils are equal, round, and reactive to light. Right eye exhibits no discharge. Left eye exhibits no discharge.  Neck: Normal range of motion. Neck supple. No JVD present. No tracheal deviation present. No thyromegaly present.  Cardiovascular: Normal rate, regular rhythm and normal heart sounds.   Pulmonary/Chest: No  stridor. No respiratory distress. She has no wheezes.  Abdominal: Soft. Bowel sounds are normal. She exhibits no distension and no mass. There is no tenderness. There is no rebound and no guarding.  Musculoskeletal: She exhibits no edema or tenderness.  Lymphadenopathy:    She has no cervical adenopathy.  Neurological: She displays normal reflexes. No cranial nerve deficit. She exhibits normal muscle tone. Coordination normal.  Skin: No rash noted. No erythema.  Psychiatric: She has a normal mood and affect. Her behavior is normal. Judgment and thought content normal.   No edema  Lab Results  Component Value Date   WBC 10.3 12/10/2013   HGB 13.4 12/10/2013   HCT 38.8 12/10/2013   PLT 240 12/10/2013   GLUCOSE 90 04/27/2014   CHOL 175 10/07/2011   TRIG 85.0 10/07/2011   HDL 58.70 10/07/2011   LDLDIRECT 120.1 07/22/2010   LDLCALC 99 10/07/2011   ALT 16 10/07/2011   AST 25 10/07/2011   NA 136 04/27/2014   K 4.9 04/27/2014   CL 100 04/27/2014   CREATININE 0.9 04/27/2014   BUN 21 04/27/2014   CO2 29 04/27/2014   TSH 2.62 04/27/2014   HGBA1C 5.4 12/10/2013        Assessment & Plan:

## 2014-08-21 NOTE — Progress Notes (Signed)
Pre visit review using our clinic review tool, if applicable. No additional management support is needed unless otherwise documented below in the visit note. 

## 2014-08-21 NOTE — Assessment & Plan Note (Signed)
Vit D 

## 2014-08-21 NOTE — Assessment & Plan Note (Signed)
Discussed.

## 2014-09-27 ENCOUNTER — Encounter: Payer: Self-pay | Admitting: Nurse Practitioner

## 2014-09-27 ENCOUNTER — Ambulatory Visit: Payer: Self-pay | Admitting: Internal Medicine

## 2014-09-27 ENCOUNTER — Telehealth: Payer: Self-pay | Admitting: Internal Medicine

## 2014-09-27 ENCOUNTER — Ambulatory Visit (INDEPENDENT_AMBULATORY_CARE_PROVIDER_SITE_OTHER): Payer: BLUE CROSS/BLUE SHIELD | Admitting: Nurse Practitioner

## 2014-09-27 ENCOUNTER — Telehealth: Payer: Self-pay | Admitting: *Deleted

## 2014-09-27 VITALS — BP 94/70 | HR 80 | Temp 98.6°F | Ht 63.0 in | Wt 135.0 lb

## 2014-09-27 DIAGNOSIS — A084 Viral intestinal infection, unspecified: Secondary | ICD-10-CM

## 2014-09-27 NOTE — Telephone Encounter (Signed)
Patient Name: Amber Shelton  DOB: 1951/07/26    Nurse Assessment  Nurse: Yetta BarreJones, RN, Miranda Date/Time (Eastern Time): 09/27/2014 9:27:41 AM  Confirm and document reason for call. If symptomatic, describe symptoms. ---Caller states she is having diarrhea and nausea. She is having trouble eating and not sure what to do about her heart medication. She states she is having diarrhea and nausea. The bottles of Metoprolol, Flecainide state that it should be taken with food.  Has the patient traveled out of the country within the last 30 days? ---No  Does the patient require triage? ---Yes  Related visit to physician within the last 2 weeks? ---No  Does the PT have any chronic conditions? (i.e. diabetes, asthma, etc.) ---Yes  List chronic conditions. ---Heart rhythm, HTN     Guidelines    Guideline Title Affirmed Question Affirmed Notes  Diarrhea [1] Age > 60 years AND [2] > 6 diarrhea stools in past 24 hours    Final Disposition User   See Physician within 4 Hours (or PCP triage) Yetta BarreJones, RN, [facility]

## 2014-09-27 NOTE — Progress Notes (Signed)
   Subjective:    Patient ID: Amber CorpusMelba W Shelton, female    DOB: 28-Apr-1951, 64 y.o.   MRN: [id number]  Diarrhea  This is a new problem. The current episode started today. The problem occurs 5 to 10 times per day. The problem has been unchanged. The stool consistency is described as watery. The patient states that diarrhea awakens her from sleep. Associated symptoms include bloating, chills and headaches (chronic). Pertinent negatives include no abdominal pain, coughing, fever or vomiting. Nothing aggravates the symptoms. Risk factors include ill contacts (mother is in nursing home-she has been vomiting & having diarrhea.). She has tried nothing for the symptoms.      Review of Systems  Constitutional: Positive for chills and fatigue. Negative for fever.  HENT: Positive for sore throat. Negative for congestion. Sneezing: 2 weeks.   Respiratory: Negative for cough, chest tightness, shortness of breath and wheezing.   Cardiovascular: Negative for chest pain.  Gastrointestinal: Positive for diarrhea and bloating. Negative for vomiting and abdominal pain.  Musculoskeletal: Negative for back pain.  Neurological: Positive for dizziness and headaches (chronic).       Objective:   Physical Exam  Constitutional: She is oriented to person, place, and time. She appears well-developed and well-nourished. She appears distressed.  Looks tired  HENT:  Head: Normocephalic and atraumatic.  Eyes: Conjunctivae are normal. Right eye exhibits no discharge. Left eye exhibits no discharge.  Cardiovascular: Normal rate, regular rhythm and normal heart sounds.   No murmur heard. Pulmonary/Chest: Effort normal and breath sounds normal.  Abdominal: Soft. She exhibits no distension and no mass. There is no tenderness. There is no rebound and no guarding.  hyperactv BS  Neurological: She is alert and oriented to person, place, and time.  Skin: Skin is warm and dry.  Psychiatric: She has a normal mood and affect.  Her behavior is normal. Thought content normal.  Vitals reviewed.         Assessment & Plan:  1. Viral gastroenteritis Symptom management See pt instructions F/u prn

## 2014-09-27 NOTE — Telephone Encounter (Signed)
[facility] - Client TELEPHONE ADVICE RECORD [facility] Patient Name: Amber LungMELBA Dooley Gender: Female DOB: 01-24-1951 Age: 89 Y 7 M 9 D Return Phone Number: [phone] (Primary) Address: City/State/Zip:  Client [facility] - Client Client Site [facility] Day Physician Plotnikov, Alex Contact Type Call Call Type Triage / Clinical Relationship To Patient Self Appointment Disposition EMR Appointment Scheduled Return Phone Number [phone] (Primary) Chief Complaint Flu Symptom Initial Comment Caller states she has had diarrhea, chills, body aches, nausea. She is on heart medication. She is suppose to take it with food, and she has only had a little chicken soup, and crackers. PreDisposition Call Doctor Nurse Assessment Nurse: Yetta BarreJones, RN, Miranda Date/Time Lamount Cohen(Eastern Time): 09/27/2014 9:27:41 AM Confirm and document reason for call. If symptomatic, describe symptoms. ---Caller states she is having diarrhea and nausea. She is having trouble eating and not sure what to do about her heart medication. She states she is having diarrhea and nausea. The bottles of Metoprolol, Flecainide state that it should be taken with food. Has the patient traveled out of the country within the last 30 days? ---No Does the patient require triage? ---Yes Related visit to physician within the last 2 weeks? ---No Does the PT have any chronic conditions? (i.e. diabetes, asthma, etc.) ---Yes List chronic conditions. ---Heart rhythm, HTN Guidelines Guideline Title Affirmed Question Affirmed Notes Nurse Date/Time (Eastern Time) Diarrhea [1] Age > 60 years AND [2] > 6 diarrhea stools in past 24 hours Yetta BarreJones, RN, Miranda 09/27/2014 9:35:27 AM Disp. Time Lamount Cohen(Eastern Time) Disposition Final User 09/27/2014 9:42:44 AM See Physician within 4 Hours (or PCP triage) Yes Yetta BarreJones, RN, Miranda Caller Understands: Yes PLEASE NOTE: All timestamps  contained within this report are represented as [place] Standard Time. CONFIDENTIALTY NOTICE: This fax transmission is intended only for the addressee. It contains information that is legally privileged, confidential or otherwise protected from use or disclosure. If you are not the intended recipient, you are strictly prohibited from reviewing, disclosing, copying using or disseminating any of this information or taking any action in reliance on or regarding this information. If you have received this fax in error, please notify us immediately by telephone so that we can arrange for its return to us. Phone: [phone], Toll-Free: [phone], Fax: [phone] Page: 2 of 2 Call Id: [id number] Disagree/Comply: Comply Care Advice Given Per Guideline SEE PHYSICIAN WITHIN 4 HOURS (or PCP triage): CLEAR FLUIDS: * Drink more fluids. * Sip water or a sports - rehydration drink (Gatorade or Powerade) * Other options: oral rehydration solution (Pedialyte or Rehydralyte) . CALL BACK IF: * You become worse. CARE ADVICE given per Diarrhea (Adult) guideline. After Care Instructions Given Call Event Type User Date / Time Description Referrals REFERRED TO PCP OFFICE

## 2014-09-27 NOTE — Patient Instructions (Signed)
You can make your own hydration fluid or get gatorade or pedialyte: 1 cup juice, 1 cup water, 1/2 tsp honey, pinch salt, 1/4 tsp baking soda. You can freeze this for popsicles if desired.  Symptoms may last up to 1 week. Stay hydrated-sip or munch popsicles every hour. Drink additional 8 oz fluid w/every loose stool. Lots of handwashing. Go to ER if you are unable to keep down fluids.   Viral Gastroenteritis Viral gastroenteritis is also known as stomach flu. This condition affects the stomach and intestinal tract. It can cause sudden diarrhea and vomiting. The illness typically lasts 3 to 8 days. Most people develop an immune response that eventually gets rid of the virus. While this natural response develops, the virus can make you quite ill. CAUSES  Many different viruses can cause gastroenteritis, such as rotavirus or noroviruses. You can catch one of these viruses by consuming contaminated food or water. You may also catch a virus by sharing utensils or other personal items with an infected person or by touching a contaminated surface. SYMPTOMS  The most common symptoms are diarrhea and vomiting. These problems can cause a severe loss of body fluids (dehydration) and a body salt (electrolyte) imbalance. Other symptoms may include:  Fever.  Headache.  Fatigue.  Abdominal pain. DIAGNOSIS  Your caregiver can usually diagnose viral gastroenteritis based on your symptoms and a physical exam. A stool sample may also be taken to test for the presence of viruses or other infections. TREATMENT  This illness typically goes away on its own. Treatments are aimed at rehydration. The most serious cases of viral gastroenteritis involve vomiting so severely that you are not able to keep fluids down. In these cases, fluids must be given through an intravenous line (IV). HOME CARE INSTRUCTIONS   Drink enough fluids to keep your urine clear or pale yellow. Drink small amounts of fluids frequently and  increase the amounts as tolerated.  Ask your caregiver for specific rehydration instructions.  Avoid:  Foods high in sugar.  Alcohol.  Carbonated drinks.  Tobacco.  Juice.  Caffeine drinks.  Extremely hot or cold fluids.  Fatty, greasy foods.  Too much intake of anything at one time.  Dairy products until 24 to 48 hours after diarrhea stops.  You may consume probiotics. Probiotics are active cultures of beneficial bacteria. They may lessen the amount and number of diarrheal stools in adults. Probiotics can be found in yogurt with active cultures and in supplements.  Wash your hands well to avoid spreading the virus.  Only take over-the-counter or prescription medicines for pain, discomfort, or fever as directed by your caregiver. Do not give aspirin to children. Antidiarrheal medicines are not recommended.  Ask your caregiver if you should continue to take your regular prescribed and over-the-counter medicines.  Keep all follow-up appointments as directed by your caregiver. SEEK IMMEDIATE MEDICAL CARE IF:   You are unable to keep fluids down.  You do not urinate at least once every 6 to 8 hours.  You develop shortness of breath.  You notice blood in your stool or vomit. This may look like coffee grounds.  You have abdominal pain that increases or is concentrated in one small area (localized).  You have persistent vomiting or diarrhea.  You have a fever.  The patient is a child younger than 3 months, and he or she has a fever.  The patient is a child older than 3 months, and he or she has a fever and persistent symptoms.  The patient is a child older than 3 months, and he or she has a fever and symptoms suddenly get worse.  The patient is a baby, and he or she has no tears when crying. MAKE SURE YOU:   Understand these instructions.  Will watch your condition.  Will get help right away if you are not doing well or get worse. Document Released:  09/07/2005 Document Revised: 11/30/2011 Document Reviewed: 06/24/2011 [facility] Patient Information 2015 [place], [place]. This information is not intended to replace advice given to you by your health care provider. Make sure you discuss any questions you have with your health care provider.

## 2014-09-27 NOTE — Telephone Encounter (Signed)
Cont heart meds Take Imodium AD OTC Avoid milk products x 1 wk OV w/any provider if not better Thx

## 2014-09-27 NOTE — Telephone Encounter (Signed)
[facility] - Client TELEPHONE ADVICE RECORD [facility] Patient Name: Denita LungMELBA Hinger Gender: Female DOB: 1951/04/30 Age: [age] Y 7 M 9 D Return Phone Number: [phone] (Primary) Address: City/State/Zip: [place] Client [facility] - Client Client Site [facility] Day Physician Plotnikov, Alex Contact Type Call Call Type Triage / Clinical Relationship To Patient Self Appointment Disposition EMR Appointment Scheduled Return Phone Number [phone] (Primary) Chief Complaint Flu Symptom Initial Comment Caller states she has had diarrhea, chills, body aches, nausea. She is on heart medication. She is suppose to take it with food, and she has only had a little chicken soup, and crackers. PreDisposition Call Doctor Nurse Assessment Nurse: Yetta BarreJones, RN, Miranda Date/Time Lamount Cohen(Eastern Time): 09/27/2014 9:27:41 AM Confirm and document reason for call. If symptomatic, describe symptoms. ---Caller states she is having diarrhea and nausea. She is having trouble eating and not sure what to do about her heart medication. She states she is having diarrhea and nausea. The bottles of Metoprolol, Flecainide state that it should be taken with food. Has the patient traveled out of the country within the last 30 days? ---No Does the patient require triage? ---Yes Related visit to physician within the last 2 weeks? ---No Does the PT have any chronic conditions? (i.e. diabetes, asthma, etc.) ---Yes List chronic conditions. ---Heart rhythm, HTN Guidelines Guideline Title Affirmed Question Affirmed Notes Nurse Date/Time (Eastern Time) Diarrhea [1] Age > 60 years AND [2] > 6 diarrhea stools in past 24 hours Yetta BarreJones, RN, Miranda 09/27/2014 9:35:27 AM Disp. Time Lamount Cohen(Eastern Time) Disposition Final User 09/27/2014 9:42:44 AM See Physician within 4 Hours (or PCP triage) Yes Yetta BarreJones, RN, Miranda Caller Understands: Yes PLEASE NOTE: All timestamps  contained within this report are represented as [place] Standard Time. CONFIDENTIALTY NOTICE: This fax transmission is intended only for the addressee. It contains information that is legally privileged, confidential or otherwise protected from use or disclosure. If you are not the intended recipient, you are strictly prohibited from reviewing, disclosing, copying using or disseminating any of this information or taking any action in reliance on or regarding this information. If you have received this fax in error, please notify us immediately by telephone so that we can arrange for its return to us. Phone: [phone], Toll-Free: [phone], Fax: [phone] Page: 2 of 2 Call Id: [id number] Disagree/Comply: Comply Care Advice Given Per Guideline SEE PHYSICIAN WITHIN 4 HOURS (or PCP triage): CLEAR FLUIDS: * Drink more fluids. * Sip water or a sports - rehydration drink (Gatorade or Powerade) * Other options: oral rehydration solution (Pedialyte or Rehydralyte) . CALL BACK IF: * You become worse. CARE ADVICE given per Diarrhea (Adult) guideline. After Care Instructions Given Call Event Type User Date / Time Description

## 2014-09-27 NOTE — Progress Notes (Signed)
Pre visit review using our clinic review tool, if applicable. No additional management support is needed unless otherwise documented below in the visit note. 

## 2014-09-27 NOTE — Telephone Encounter (Signed)
Please advise on below  

## 2014-09-28 NOTE — Telephone Encounter (Signed)
Notified pt with md response. Pt stated she actually came in yesterday saw Layne, NP.../lmb

## 2014-11-08 ENCOUNTER — Telehealth: Payer: Self-pay | Admitting: Cardiology

## 2014-11-08 NOTE — Telephone Encounter (Signed)
Monitoring service called pt has had episode of PAF, rate of 170.  I called the pt and she was stable and not aware that she had the a fib.  She did relate she had forgotten her meds this AM.  We discussed importance of taking.  She is aware.

## 2014-12-08 ENCOUNTER — Ambulatory Visit (INDEPENDENT_AMBULATORY_CARE_PROVIDER_SITE_OTHER): Payer: BLUE CROSS/BLUE SHIELD | Admitting: Family Medicine

## 2014-12-08 ENCOUNTER — Encounter: Payer: Self-pay | Admitting: Family Medicine

## 2014-12-08 VITALS — BP 116/70 | HR 59 | Temp 98.3°F | Wt 134.0 lb

## 2014-12-08 DIAGNOSIS — J069 Acute upper respiratory infection, unspecified: Secondary | ICD-10-CM

## 2014-12-08 NOTE — Progress Notes (Signed)
   [facility] [address] [place], [place] [id number] Phone: [phone]  Patient ID: Amber CorpusMelba W Moynahan MRN: [id number], DOB: 1951-01-22, 64 y.o. Date of Encounter: 12/08/2014  Primary Physician:  Sonda PrimesAlex Plotnikov, MD   Chief Complaint:  Chief Complaint  Patient presents with  . Cough    non productive w/ chest congestion x 5 days--pt has taken OTC Benadryl and Mucinex chest congestion w/ minimal relief---pt has had sick contacts at work  . Sinusitis    headache--facial pain and pressure   Subjective:   This 64 y.o. female patient presents with runny nose, sneezing, cough, sore throat, malaise and minimal / low-grade fever . Viral URI / bronchitis.  Monday, thought getting allergies, and cough getting deeper and harder. No fever. Sleeping OK at night.  First thing in the morning and will have some hot tea. Then will get a little worse at night.   ? recent exposure to others with similar symptoms.   The patent denies sore throat as the primary complaint. Denies sthortness of breath/wheezing, high fever, chest pain, rhinits for more than 14 days, significant myalgia, otalgia, facial pain, abdominal pain, changes in bowel or bladder.  PMH, PHS, Allergies, Problem List, Medications, Family History, and Social History have all been reviewed.  ROS as above, eating and drinking - tolerating PO. Urinating normally. No excessive vomitting or diarrhea. O/w as above.  Objective:   Blood pressure 116/70, pulse 59, temperature 98.3 F (36.8 C), temperature source Oral, weight 134 lb (60.782 kg), SpO2 98 %.  GEN: WDWN, Non-toxic, Atraumatic, normocephalic. A and O x 3. HEENT: Oropharynx clear without exudate, MMM, no significant LAD, mild rhinnorhea Ears: TM clear, COL visualized with good landmarks CV: RRR, no m/g/r. Pulm: CTA B, no wheezes, rhonchi, or crackles, normal respiratory effort. EXT: no c/c/e Psych: well oriented, neither  depressed nor anxious in appearance  Assessment and Plan:   URI (upper respiratory infection)   Supportive care reviewed with patient. See patient instruction section.  Signed,  Elpidio GaleaSpencer T. Tesa Meadors, MD   Patient's Medications  New Prescriptions   No medications on file  Previous Medications   ALPRAZOLAM (XANAX) 0.5 MG TABLET    Take 1 tablet (0.5 mg total) by mouth 3 (three) times daily as needed for anxiety.   CALCIUM CARBONATE (OS-CAL) 600 MG TABS    Take 600 mg by mouth 2 (two) times daily with a meal.     CHOLECALCIFEROL (VITAMIN D) 1000 UNITS TABLET    Take 1 tablet (1,000 Units total) by mouth daily.   DIPHENHYDRAMINE (BENYLIN) 12.5 MG/5ML LIQUID    Take 12.5 mg by mouth at bedtime as needed. Sleep/ allergic reaction.   FLECAINIDE (TAMBOCOR) 50 MG TABLET    Take 0.5 tablets (25 mg total) by mouth 2 (two) times daily.   FUROSEMIDE (LASIX) 20 MG TABLET    Take 0.5-1 tablets (10-20 mg total) by mouth daily as needed for edema.   METOPROLOL TARTRATE (LOPRESSOR) 25 MG TABLET    Take 25 mg by mouth 2 (two) times daily.    MULTIVITAMIN (POLY-VITAMINS) CHEW    Chew 1 tablet by mouth daily.    POTASSIUM CHLORIDE (KLOR-CON 10) 10 MEQ TABLET    Take 1 tablet (10 mEq total) by mouth daily as needed. With furosemide  Modified Medications   No medications on file  Discontinued Medications   No medications on file

## 2014-12-08 NOTE — Progress Notes (Signed)
Pre visit review using our clinic review tool, if applicable. No additional management support is needed unless otherwise documented below in the visit note. 

## 2014-12-25 ENCOUNTER — Ambulatory Visit (INDEPENDENT_AMBULATORY_CARE_PROVIDER_SITE_OTHER): Payer: BLUE CROSS/BLUE SHIELD | Admitting: Internal Medicine

## 2014-12-25 ENCOUNTER — Encounter: Payer: Self-pay | Admitting: Internal Medicine

## 2014-12-25 VITALS — BP 110/64 | HR 58 | Wt 135.0 lb

## 2014-12-25 DIAGNOSIS — I480 Paroxysmal atrial fibrillation: Secondary | ICD-10-CM

## 2014-12-25 DIAGNOSIS — Z658 Other specified problems related to psychosocial circumstances: Secondary | ICD-10-CM | POA: Diagnosis not present

## 2014-12-25 DIAGNOSIS — M810 Age-related osteoporosis without current pathological fracture: Secondary | ICD-10-CM

## 2014-12-25 DIAGNOSIS — F439 Reaction to severe stress, unspecified: Secondary | ICD-10-CM

## 2014-12-25 MED ORDER — ALPRAZOLAM 0.5 MG PO TABS: 0.5000 mg | ORAL_TABLET | Freq: Three times a day (TID) | ORAL | Status: DC | PRN

## 2014-12-25 NOTE — Progress Notes (Signed)
Pre visit review using our clinic review tool, if applicable. No additional management support is needed unless otherwise documented below in the visit note. 

## 2014-12-25 NOTE — Assessment & Plan Note (Signed)
Xanax prn  Potential benefits of a long term benzodiazepines  use as well as potential risks  and complications were explained to the patient and were aknowledged. 

## 2014-12-25 NOTE — Progress Notes (Signed)
   Subjective:    HPI  F/u feeling better since she started to take less Flecanide 1/2 in am, 1 at night  F/u palpitations. F/u stress - better w/meds adjustment  F/u wt gain, bloating x 1 mo  Mother w/dementia  Wt Readings from Last 3 Encounters:  12/25/14 135 lb (61.236 kg)  12/08/14 134 lb (60.782 kg)  09/27/14 135 lb (61.236 kg)   BP Readings from Last 3 Encounters:  12/25/14 110/64  12/08/14 116/70  09/27/14 94/70      Review of Systems  Constitutional: Positive for fatigue. Negative for chills, activity change, appetite change and unexpected weight change.  HENT: Negative for congestion, mouth sores, sinus pressure and voice change.   Eyes: Negative for visual disturbance.  Respiratory: Negative for cough, chest tightness and wheezing.   Cardiovascular: Negative for chest pain, palpitations and leg swelling.  Gastrointestinal: Negative for nausea, vomiting and abdominal pain.  Genitourinary: Negative for frequency, difficulty urinating and vaginal pain.  Musculoskeletal: Negative for back pain and gait problem.  Skin: Negative for pallor, rash and wound.  Neurological: Negative for dizziness, tremors, syncope, weakness, numbness and headaches.  Psychiatric/Behavioral: Positive for sleep disturbance and decreased concentration. Negative for suicidal ideas and confusion. The patient is nervous/anxious.        Objective:   Physical Exam  Constitutional: She appears well-developed. No distress.  HENT:  Head: Normocephalic.  Right Ear: External ear normal.  Left Ear: External ear normal.  Nose: Nose normal.  Mouth/Throat: Oropharynx is clear and moist.  Eyes: Conjunctivae are normal. Pupils are equal, round, and reactive to light. Right eye exhibits no discharge. Left eye exhibits no discharge.  Neck: Normal range of motion. Neck supple. No JVD present. No tracheal deviation present. No thyromegaly present.  Cardiovascular: Normal rate, regular rhythm and normal  heart sounds.   Pulmonary/Chest: No stridor. No respiratory distress. She has no wheezes.  Abdominal: Soft. Bowel sounds are normal. She exhibits no distension and no mass. There is no tenderness. There is no rebound and no guarding.  Musculoskeletal: She exhibits no edema or tenderness.  Lymphadenopathy:    She has no cervical adenopathy.  Neurological: She displays normal reflexes. No cranial nerve deficit. She exhibits normal muscle tone. Coordination normal.  Skin: No rash noted. No erythema.  Psychiatric: She has a normal mood and affect. Her behavior is normal. Judgment and thought content normal.   No edema  Lab Results  Component Value Date   WBC 10.3 12/10/2013   HGB 13.4 12/10/2013   HCT 38.8 12/10/2013   PLT 240 12/10/2013   GLUCOSE 90 04/27/2014   CHOL 175 10/07/2011   TRIG 85.0 10/07/2011   HDL 58.70 10/07/2011   LDLDIRECT 120.1 07/22/2010   LDLCALC 99 10/07/2011   ALT 16 10/07/2011   AST 25 10/07/2011   NA 136 04/27/2014   K 4.9 04/27/2014   CL 100 04/27/2014   CREATININE 0.9 04/27/2014   BUN 21 04/27/2014   CO2 29 04/27/2014   TSH 2.62 04/27/2014   HGBA1C 5.4 12/10/2013        Assessment & Plan:

## 2014-12-25 NOTE — Assessment & Plan Note (Signed)
On Flecanide, Metoprolol

## 2014-12-25 NOTE — Assessment & Plan Note (Signed)
Vit D 

## 2015-01-23 ENCOUNTER — Encounter: Payer: Self-pay | Admitting: Internal Medicine

## 2015-03-19 ENCOUNTER — Encounter: Payer: Self-pay | Admitting: Cardiology

## 2015-03-19 DIAGNOSIS — I480 Paroxysmal atrial fibrillation: Secondary | ICD-10-CM

## 2015-03-19 DIAGNOSIS — M810 Age-related osteoporosis without current pathological fracture: Secondary | ICD-10-CM

## 2015-03-19 DIAGNOSIS — N952 Postmenopausal atrophic vaginitis: Secondary | ICD-10-CM

## 2015-03-19 NOTE — Progress Notes (Signed)
Patient ID: Amber Shelton, female   DOB: 1951-03-29, 64 y.o.   MRN: [id number]   Amber Corpusltman, Mariadejesus W  Date of visit:  03/19/2015 DOB:  [birth year]    Age:  64 yrs. Medical record number:  [id number]     Account number:  [id number] Primary Care Provider: Janeal HolmesPLOTNIKOV, ALEX V  CURRENT DIAGNOSES  1. Paroxysmal atrial fibrillation  2. Chronic venous insufficiency  TREADMILL  The patient exercised on the standard Bruce protocol a total of 5 minutes into Bruce stage 2 achieving a workload of 7 METS. The test was stopped due to dyspnea and fatigue. The patient had no chest pain suggestive of angina. Heart rate rose to which was 85% of predicted maximum. Blood pressure response was normal.  12-lead EKG is abnormal at rest with lateral ST depression. There is significant ST depression very early after starting exercise and quickly resolves and she then develops ST depression laterally at the end of exercise associated with some T wave changes in recovery. No ventricular arrhythmias were noted with exercise.  IMPRESSIONS:  1. Clinically negative for ischemia 2. EKG positive for ischemia  RECOMMENDATIONS:  The patient had a previously abnormal treadmill with a negative myocardial perfusion scan at another office. She has no arrhythmias with exercise. She has not had recurrent atrial fibrillation and is tolerating flecainide well. I will see her in followup in 6 months.   TODAYS ORDERS  1. Return Visit: 6 months  2. 12 Lead EKG: 6 months                        Cardiology Physician:  Darden PalmerW. Spencer Tilley, Jr. MD [facility]

## 2015-05-23 ENCOUNTER — Other Ambulatory Visit: Payer: Self-pay | Admitting: *Deleted

## 2015-05-23 MED ORDER — FUROSEMIDE 20 MG PO TABS: 10.0000 mg | ORAL_TABLET | Freq: Every day | ORAL | Status: DC | PRN

## 2015-06-25 ENCOUNTER — Ambulatory Visit (INDEPENDENT_AMBULATORY_CARE_PROVIDER_SITE_OTHER): Payer: BLUE CROSS/BLUE SHIELD | Admitting: Internal Medicine

## 2015-06-25 ENCOUNTER — Encounter: Payer: Self-pay | Admitting: Internal Medicine

## 2015-06-25 VITALS — BP 120/88 | HR 71 | Ht 63.0 in | Wt 137.0 lb

## 2015-06-25 DIAGNOSIS — M810 Age-related osteoporosis without current pathological fracture: Secondary | ICD-10-CM

## 2015-06-25 DIAGNOSIS — Z658 Other specified problems related to psychosocial circumstances: Secondary | ICD-10-CM

## 2015-06-25 DIAGNOSIS — F439 Reaction to severe stress, unspecified: Secondary | ICD-10-CM

## 2015-06-25 DIAGNOSIS — I480 Paroxysmal atrial fibrillation: Secondary | ICD-10-CM | POA: Diagnosis not present

## 2015-06-25 MED ORDER — ALPRAZOLAM 0.5 MG PO TABS: 0.5000 mg | ORAL_TABLET | Freq: Three times a day (TID) | ORAL | Status: DC | PRN

## 2015-06-25 NOTE — Assessment & Plan Note (Signed)
On Flecanide, Metoprolol

## 2015-06-25 NOTE — Assessment & Plan Note (Signed)
Vit D 

## 2015-06-25 NOTE — Progress Notes (Signed)
Subjective:  Patient ID: Amber Shelton, female    DOB: January 15, 1951  Age: 64 y.o. MRN: [id number]  CC: No chief complaint on file.   HPI Lanna W Papadopoulos presents for anxiety, palpitations f/u. Dtr's wedding in 1 week  Outpatient Prescriptions Prior to Visit  Medication Sig Dispense Refill  . ALPRAZolam (XANAX) 0.5 MG tablet Take 1 tablet (0.5 mg total) by mouth 3 (three) times daily as needed for anxiety. 90 tablet 2  . calcium carbonate (OS-CAL) 600 MG TABS Take 600 mg by mouth 2 (two) times daily with a meal.      . diphenhydrAMINE (BENYLIN) 12.5 MG/5ML liquid Take 12.5 mg by mouth at bedtime as needed. Sleep/ allergic reaction.    . flecainide (TAMBOCOR) 50 MG tablet Take 0.5 tablets (25 mg total) by mouth 2 (two) times daily. (Patient taking differently: Take 25 mg by mouth 2 (two) times daily. 1/2 tablet in AM, 1 tablet in PM) 60 tablet 11  . furosemide (LASIX) 20 MG tablet Take 0.5-1 tablets (10-20 mg total) by mouth daily as needed for edema. 30 tablet 3  . metoprolol tartrate (LOPRESSOR) 25 MG tablet Take 25 mg by mouth 2 (two) times daily.     . multivitamin (POLY-VITAMINS) CHEW Chew 1 tablet by mouth daily.     . potassium chloride (KLOR-CON 10) 10 MEQ tablet Take 1 tablet (10 mEq total) by mouth daily as needed. With furosemide 30 tablet 3   No facility-administered medications prior to visit.    ROS Review of Systems  Constitutional: Negative for chills, activity change, appetite change, fatigue and unexpected weight change.  HENT: Negative for congestion, mouth sores and sinus pressure.   Eyes: Negative for visual disturbance.  Respiratory: Negative for cough and chest tightness.   Gastrointestinal: Negative for nausea and abdominal pain.  Genitourinary: Negative for frequency, difficulty urinating and vaginal pain.  Musculoskeletal: Negative for back pain and gait problem.  Skin: Negative for pallor and rash.  Neurological: Negative for dizziness, tremors, weakness,  numbness and headaches.  Psychiatric/Behavioral: Negative for confusion and sleep disturbance. The patient is nervous/anxious.     Objective:  BP 120/88 mmHg  Pulse 71  Ht  (1.6 m)  Wt 137 lb (62.143 kg)  BMI 24.27 kg/m2  SpO2 96%  BP Readings from Last 3 Encounters:  06/25/15 120/88  12/25/14 110/64  12/08/14 116/70    Wt Readings from Last 3 Encounters:  06/25/15 137 lb (62.143 kg)  12/25/14 135 lb (61.236 kg)  12/08/14 134 lb (60.782 kg)    Physical Exam  Constitutional: She appears well-developed. No distress.  HENT:  Head: Normocephalic.  Right Ear: External ear normal.  Left Ear: External ear normal.  Nose: Nose normal.  Mouth/Throat: Oropharynx is clear and moist.  Eyes: Conjunctivae are normal. Pupils are equal, round, and reactive to light. Right eye exhibits no discharge. Left eye exhibits no discharge.  Neck: Normal range of motion. Neck supple. No JVD present. No tracheal deviation present. No thyromegaly present.  Cardiovascular: Normal rate, regular rhythm and normal heart sounds.   Pulmonary/Chest: No stridor. No respiratory distress. She has no wheezes.  Abdominal: Soft. Bowel sounds are normal. She exhibits no distension and no mass. There is no tenderness. There is no rebound and no guarding.  Musculoskeletal: She exhibits no edema or tenderness.  Lymphadenopathy:    She has no cervical adenopathy.  Neurological: She displays normal reflexes. No cranial nerve deficit. She exhibits normal muscle tone. Coordination normal.  Skin:  No rash noted. No erythema.  Psychiatric: She has a normal mood and affect. Her behavior is normal. Judgment and thought content normal.    Lab Results  Component Value Date   WBC 10.3 12/10/2013   HGB 13.4 12/10/2013   HCT 38.8 12/10/2013   PLT 240 12/10/2013   GLUCOSE 90 04/27/2014   CHOL 175 10/07/2011   TRIG 85.0 10/07/2011   HDL 58.70 10/07/2011   LDLDIRECT 120.1 07/22/2010   LDLCALC 99 10/07/2011   ALT 16  10/07/2011   AST 25 10/07/2011   NA 136 04/27/2014   K 4.9 04/27/2014   CL 100 04/27/2014   CREATININE 0.9 04/27/2014   BUN 21 04/27/2014   CO2 29 04/27/2014   TSH 2.62 04/27/2014   HGBA1C 5.4 12/10/2013    US Abdomen Complete  06/12/2014   CLINICAL DATA:  Abdominal pain.  EXAM: ULTRASOUND ABDOMEN COMPLETE  COMPARISON:  None.  FINDINGS: Gallbladder:  No gallstones or wall thickening visualized. No sonographic Murphy sign noted.  Common bile duct:  Diameter: 3.1 mm which is within normal limits.  Liver:  No focal lesion identified. Within normal limits in parenchymal echogenicity.  IVC:  No abnormality visualized.  Pancreas:  Visualized portion unremarkable.  Spleen:  Size and appearance within normal limits.  Right Kidney:  Length: 11.2 cm. 9 mm simple cyst is noted in the midpole. Echogenicity within normal limits. No mass or hydronephrosis visualized.  Left Kidney:  Length: 11.4 cm. Echogenicity within normal limits. No mass or hydronephrosis visualized.  Abdominal aorta:  Atherosclerotic disease is noted, but no aneurysm is seen.  Other findings:  None.  IMPRESSION: No significant abnormality seen in the abdomen.   Electronically Signed   By: [name] M.D.   On: 06/12/2014 09:10    Assessment & Plan:   There are no diagnoses linked to this encounter. I am having Ms. Coonrod maintain her diphenhydrAMINE, calcium carbonate, multivitamin, metoprolol tartrate, flecainide, potassium chloride, ALPRAZolam, and furosemide.  No orders of the defined types were placed in this encounter.     Follow-up: No Follow-up on file.  Sonda Primes, MD

## 2015-06-25 NOTE — Progress Notes (Signed)
Pre visit review using our clinic review tool, if applicable. No additional management support is needed unless otherwise documented below in the visit note. 

## 2015-06-25 NOTE — Assessment & Plan Note (Signed)
Dtr's wedding in 1 week

## 2015-08-27 ENCOUNTER — Other Ambulatory Visit (INDEPENDENT_AMBULATORY_CARE_PROVIDER_SITE_OTHER): Payer: BLUE CROSS/BLUE SHIELD

## 2015-08-27 ENCOUNTER — Other Ambulatory Visit: Payer: Self-pay | Admitting: Internal Medicine

## 2015-08-27 ENCOUNTER — Other Ambulatory Visit: Payer: Self-pay | Admitting: *Deleted

## 2015-08-27 DIAGNOSIS — Z658 Other specified problems related to psychosocial circumstances: Secondary | ICD-10-CM | POA: Diagnosis not present

## 2015-08-27 DIAGNOSIS — F439 Reaction to severe stress, unspecified: Secondary | ICD-10-CM

## 2015-08-27 DIAGNOSIS — I480 Paroxysmal atrial fibrillation: Secondary | ICD-10-CM

## 2015-08-27 LAB — CBC WITH DIFFERENTIAL/PLATELET
BASOS PCT: 0.6 % (ref 0.0–3.0)
Basophils Absolute: 0.1 10*3/uL (ref 0.0–0.1)
EOS ABS: 0.2 10*3/uL (ref 0.0–0.7)
Eosinophils Relative: 2.8 % (ref 0.0–5.0)
HEMATOCRIT: 42 % (ref 36.0–46.0)
Hemoglobin: 13.9 g/dL (ref 12.0–15.0)
LYMPHS ABS: 3.8 10*3/uL (ref 0.7–4.0)
LYMPHS PCT: 44.8 % (ref 12.0–46.0)
MCHC: 33.1 g/dL (ref 30.0–36.0)
MCV: 90 fl (ref 78.0–100.0)
MONO ABS: 0.5 10*3/uL (ref 0.1–1.0)
Monocytes Relative: 6.2 % (ref 3.0–12.0)
NEUTROS ABS: 3.8 10*3/uL (ref 1.4–7.7)
NEUTROS PCT: 45.6 % (ref 43.0–77.0)
PLATELETS: 237 10*3/uL (ref 150.0–400.0)
RBC: 4.67 Mil/uL (ref 3.87–5.11)
RDW: 12.6 % (ref 11.5–15.5)
WBC: 8.4 10*3/uL (ref 4.0–10.5)

## 2015-08-27 LAB — URINALYSIS, ROUTINE W REFLEX MICROSCOPIC
Bilirubin Urine: NEGATIVE
Ketones, ur: NEGATIVE
Leukocytes, UA: NEGATIVE
Nitrite: NEGATIVE
PH: 7 (ref 5.0–8.0)
Total Protein, Urine: NEGATIVE
Urine Glucose: NEGATIVE
Urobilinogen, UA: 0.2 (ref 0.0–1.0)
WBC UA: NONE SEEN (ref 0–?)

## 2015-08-27 LAB — LIPID PANEL
CHOL/HDL RATIO: 4
Cholesterol: 190 mg/dL (ref 0–200)
HDL: 42.2 mg/dL (ref >39.00)
LDL Cholesterol: 112 mg/dL — ABNORMAL HIGH (ref 0–99)
NONHDL: 147.68
TRIGLYCERIDES: 180 mg/dL — AB (ref 0.0–149.0)
VLDL: 36 mg/dL (ref 0.0–40.0)

## 2015-08-27 LAB — BASIC METABOLIC PANEL
BUN: 16 mg/dL (ref 6–23)
CHLORIDE: 99 meq/L (ref 96–112)
CO2: 31 meq/L (ref 19–32)
Calcium: 9.4 mg/dL (ref 8.4–10.5)
Creatinine, Ser: 0.9 mg/dL (ref 0.40–1.20)
GFR: 67.1 mL/min (ref >60.00)
Glucose, Bld: 93 mg/dL (ref 70–99)
POTASSIUM: 4.3 meq/L (ref 3.5–5.1)
Sodium: 136 mEq/L (ref 135–145)

## 2015-08-27 LAB — TSH: TSH: 3.68 u[IU]/mL (ref 0.35–4.50)

## 2015-08-27 LAB — HEPATIC FUNCTION PANEL
ALT: 39 U/L — AB (ref 0–35)
AST: 30 U/L (ref 0–37)
Albumin: 3.9 g/dL (ref 3.5–5.2)
Alkaline Phosphatase: 116 U/L (ref 39–117)
BILIRUBIN DIRECT: 0.1 mg/dL (ref 0.0–0.3)
BILIRUBIN TOTAL: 0.6 mg/dL (ref 0.2–1.2)
Total Protein: 7.2 g/dL (ref 6.0–8.3)

## 2015-08-27 LAB — MAGNESIUM: MAGNESIUM: 1.8 mg/dL (ref 1.5–2.5)

## 2015-09-03 LAB — FLECAINIDE LEVEL: FLECAINIDE: 0.42 ug/mL (ref 0.20–1.00)

## 2015-12-24 ENCOUNTER — Ambulatory Visit (INDEPENDENT_AMBULATORY_CARE_PROVIDER_SITE_OTHER): Payer: BLUE CROSS/BLUE SHIELD | Admitting: Internal Medicine

## 2015-12-24 ENCOUNTER — Encounter: Payer: Self-pay | Admitting: Internal Medicine

## 2015-12-24 VITALS — BP 130/75 | HR 56 | Ht 63.0 in | Wt 139.0 lb

## 2015-12-24 DIAGNOSIS — Z0001 Encounter for general adult medical examination with abnormal findings: Secondary | ICD-10-CM

## 2015-12-24 DIAGNOSIS — Z0000 Encounter for general adult medical examination without abnormal findings: Secondary | ICD-10-CM | POA: Insufficient documentation

## 2015-12-24 DIAGNOSIS — I480 Paroxysmal atrial fibrillation: Secondary | ICD-10-CM

## 2015-12-24 DIAGNOSIS — F439 Reaction to severe stress, unspecified: Secondary | ICD-10-CM

## 2015-12-24 DIAGNOSIS — Z658 Other specified problems related to psychosocial circumstances: Secondary | ICD-10-CM

## 2015-12-24 MED ORDER — ALPRAZOLAM 0.5 MG PO TABS: 0.5000 mg | ORAL_TABLET | Freq: Three times a day (TID) | ORAL | Status: DC | PRN

## 2015-12-24 NOTE — Assessment & Plan Note (Addendum)
We discussed age appropriate health related issues, including available/recomended screening tests and vaccinations (declined). We discussed a need for adhering to healthy diet and exercise. Labs/EKG were reviewed/ordered. All questions were answered. GYN , mammo - pt will schedule Colon due 2023

## 2015-12-24 NOTE — Progress Notes (Signed)
Subjective:  Patient ID: Amber Shelton, female    DOB: 1951-08-02  Age: 65 y.o. MRN: [id number]  CC: Annual Exam   HPI Amber Shelton presents for a well exam. F/u anxiety, A fib, HTN  Outpatient Prescriptions Prior to Visit  Medication Sig Dispense Refill  . ALPRAZolam (XANAX) 0.5 MG tablet Take 1 tablet (0.5 mg total) by mouth 3 (three) times daily as needed for anxiety. 90 tablet 2  . calcium carbonate (OS-CAL) 600 MG TABS Take 600 mg by mouth 2 (two) times daily with a meal.      . diphenhydrAMINE (BENYLIN) 12.5 MG/5ML liquid Take 12.5 mg by mouth at bedtime as needed. Sleep/ allergic reaction.    . flecainide (TAMBOCOR) 50 MG tablet Take 0.5 tablets (25 mg total) by mouth 2 (two) times daily. (Patient taking differently: Take 25 mg by mouth 2 (two) times daily. 1/2 tablet in AM, 1 tablet in PM) 60 tablet 11  . furosemide (LASIX) 20 MG tablet Take 0.5-1 tablets (10-20 mg total) by mouth daily as needed for edema. 30 tablet 3  . metoprolol tartrate (LOPRESSOR) 25 MG tablet Take 25 mg by mouth 2 (two) times daily.     . multivitamin (POLY-VITAMINS) CHEW Chew 1 tablet by mouth daily.     . potassium chloride (KLOR-CON 10) 10 MEQ tablet Take 1 tablet (10 mEq total) by mouth daily as needed. With furosemide 30 tablet 3   No facility-administered medications prior to visit.    ROS Review of Systems  Constitutional: Negative for chills, activity change, appetite change, fatigue and unexpected weight change.  HENT: Negative for congestion, mouth sores and sinus pressure.   Eyes: Negative for visual disturbance.  Respiratory: Negative for cough and chest tightness.   Cardiovascular: Positive for palpitations. Negative for leg swelling.  Gastrointestinal: Negative for nausea and abdominal pain.  Endocrine: Negative for polydipsia.  Genitourinary: Negative for dysuria, urgency, frequency, difficulty urinating and vaginal pain.  Musculoskeletal: Negative for back pain and gait problem.    Skin: Negative for pallor and rash.  Neurological: Negative for dizziness, tremors, weakness, numbness and headaches.  Psychiatric/Behavioral: Negative for suicidal ideas, confusion and sleep disturbance.    Objective:  BP 158/80 mmHg  Pulse 56  Ht 5\' 3"  (1.6 m)  Wt 139 lb (63.05 kg)  BMI 24.63 kg/m2  SpO2 97%  BP Readings from Last 3 Encounters:  12/24/15 158/80  06/25/15 120/88  12/25/14 110/64    Wt Readings from Last 3 Encounters:  12/24/15 139 lb (63.05 kg)  06/25/15 137 lb (62.143 kg)  12/25/14 135 lb (61.236 kg)    Physical Exam  Constitutional: She appears well-developed. No distress.  HENT:  Head: Normocephalic.  Right Ear: External ear normal.  Left Ear: External ear normal.  Nose: Nose normal.  Mouth/Throat: Oropharynx is clear and moist.  Eyes: Conjunctivae are normal. Pupils are equal, round, and reactive to light. Right eye exhibits no discharge. Left eye exhibits no discharge.  Neck: Normal range of motion. Neck supple. No JVD present. No tracheal deviation present. No thyromegaly present.  Cardiovascular: Normal rate, regular rhythm and normal heart sounds.   Pulmonary/Chest: No stridor. No respiratory distress. She has no wheezes.  Abdominal: Soft. Bowel sounds are normal. She exhibits no distension and no mass. There is no tenderness. There is no rebound and no guarding.  Musculoskeletal: She exhibits no edema or tenderness.  Lymphadenopathy:    She has no cervical adenopathy.  Neurological: She displays normal reflexes. No cranial  nerve deficit. She exhibits normal muscle tone. Coordination normal.  Skin: No rash noted. No erythema.  Psychiatric: She has a normal mood and affect. Her behavior is normal. Judgment and thought content normal.    Lab Results  Component Value Date   WBC 8.4 08/27/2015   HGB 13.9 08/27/2015   HCT 42.0 08/27/2015   PLT 237.0 08/27/2015   GLUCOSE 93 08/27/2015   CHOL 190 08/27/2015   TRIG 180.0* 08/27/2015   HDL  42.20 08/27/2015   LDLDIRECT 120.1 07/22/2010   LDLCALC 112* 08/27/2015   ALT 39* 08/27/2015   AST 30 08/27/2015   NA 136 08/27/2015   K 4.3 08/27/2015   CL 99 08/27/2015   CREATININE 0.90 08/27/2015   BUN 16 08/27/2015   CO2 31 08/27/2015   TSH 3.68 08/27/2015   HGBA1C 5.4 12/10/2013    US Abdomen Complete  06/12/2014  CLINICAL DATA:  Abdominal pain. EXAM: ULTRASOUND ABDOMEN COMPLETE COMPARISON:  None. FINDINGS: Gallbladder: No gallstones or wall thickening visualized. No sonographic Murphy sign noted. Common bile duct: Diameter: 3.1 mm which is within normal limits. Liver: No focal lesion identified. Within normal limits in parenchymal echogenicity. IVC: No abnormality visualized. Pancreas: Visualized portion unremarkable. Spleen: Size and appearance within normal limits. Right Kidney: Length: 11.2 cm. 9 mm simple cyst is noted in the midpole. Echogenicity within normal limits. No mass or hydronephrosis visualized. Left Kidney: Length: 11.4 cm. Echogenicity within normal limits. No mass or hydronephrosis visualized. Abdominal aorta: Atherosclerotic disease is noted, but no aneurysm is seen. Other findings: None. IMPRESSION: No significant abnormality seen in the abdomen. Electronically Signed   By: [name] M.D.   On: 06/12/2014 09:10    Assessment & Plan:   Amber Shelton was seen today for annual exam.  Diagnoses and all orders for this visit:  PAF (paroxysmal atrial fibrillation) (HCC)  Well adult exam  Situational stress  Other orders -     ALPRAZolam (XANAX) 0.5 MG tablet; Take 1 tablet (0.5 mg total) by mouth 3 (three) times daily as needed for anxiety.  I am having Ms. Penrose maintain her diphenhydrAMINE, calcium carbonate, multivitamin, metoprolol tartrate, flecainide, potassium chloride, furosemide, and ALPRAZolam.  No orders of the defined types were placed in this encounter.     Follow-up: No Follow-up on file.  Sonda Primes, MD

## 2015-12-24 NOTE — Assessment & Plan Note (Signed)
Xanax prn  Potential benefits of a long term benzodiazepines  use as well as potential risks  and complications were explained to the patient and were aknowledged. 

## 2015-12-24 NOTE — Assessment & Plan Note (Signed)
CHA2DS2VASC score 1 On Flecanide, Metoprolol 

## 2015-12-24 NOTE — Progress Notes (Signed)
Pre visit review using our clinic review tool, if applicable. No additional management support is needed unless otherwise documented below in the visit note. 

## 2015-12-26 LAB — HM MAMMOGRAPHY

## 2016-01-02 ENCOUNTER — Encounter: Payer: Self-pay | Admitting: Internal Medicine

## 2016-02-14 ENCOUNTER — Ambulatory Visit (INDEPENDENT_AMBULATORY_CARE_PROVIDER_SITE_OTHER): Payer: BLUE CROSS/BLUE SHIELD | Admitting: Urgent Care

## 2016-02-14 ENCOUNTER — Encounter: Payer: Self-pay | Admitting: Internal Medicine

## 2016-02-14 ENCOUNTER — Telehealth: Payer: Self-pay | Admitting: Internal Medicine

## 2016-02-14 ENCOUNTER — Ambulatory Visit (INDEPENDENT_AMBULATORY_CARE_PROVIDER_SITE_OTHER): Payer: BLUE CROSS/BLUE SHIELD

## 2016-02-14 VITALS — BP 122/72 | HR 57 | Temp 97.8°F | Resp 17 | Ht 61.5 in | Wt 138.0 lb

## 2016-02-14 DIAGNOSIS — M25452 Effusion, left hip: Secondary | ICD-10-CM | POA: Diagnosis not present

## 2016-02-14 DIAGNOSIS — M25462 Effusion, left knee: Secondary | ICD-10-CM

## 2016-02-14 DIAGNOSIS — I480 Paroxysmal atrial fibrillation: Secondary | ICD-10-CM | POA: Diagnosis not present

## 2016-02-14 LAB — POCT CBC
GRANULOCYTE PERCENT: 61.2 % (ref 37–80)
HEMATOCRIT: 39.7 % (ref 37.7–47.9)
Hemoglobin: 14.1 g/dL (ref 12.2–16.2)
Lymph, poc: 3.6 — AB (ref 0.6–3.4)
MCH, POC: 31.2 pg (ref 27–31.2)
MCHC: 35.6 g/dL — AB (ref 31.8–35.4)
MCV: 87.6 fL (ref 80–97)
MID (CBC): 0.3 (ref 0–0.9)
MPV: 8.2 fL (ref 0–99.8)
PLATELET COUNT, POC: 231 10*3/uL (ref 142–424)
POC GRANULOCYTE: 6.1 (ref 2–6.9)
POC LYMPH %: 36.1 % (ref 10–50)
POC MID %: 2.7 %M (ref 0–12)
RBC: 4.53 M/uL (ref 4.04–5.48)
RDW, POC: 13 %
WBC: 10 10*3/uL (ref 4.6–10.2)

## 2016-02-14 NOTE — Telephone Encounter (Signed)
Patient Name: Amber Shelton DOB: [birth year] Initial Comment caller states she has a knot on inner thigh at the knee Nurse Assessment Nurse: Amber Elizabethrumbull, RN, Amber Shelton Date/Time (Eastern Time): 02/14/2016 9:10:34 AM Confirm and document reason for call. If symptomatic, describe symptoms. You must click the next button to save text entered. ---Caller states she developed a knot under the skin in her leg by the knee last night. No fever. No injury in the past 3 days. No pain. Has the patient traveled out of the country within the last 30 days? ---No Does the patient have any new or worsening symptoms? ---Yes Will a triage be completed? ---Yes Related visit to physician within the last 2 weeks? ---No Does the PT have any chronic conditions? (i.e. diabetes, asthma, etc.) ---Yes List chronic conditions. ---A-Fib, Panic Attacks, Anxiety Is this a behavioral health or substance abuse call? ---No Guidelines Guideline Title Affirmed Question Affirmed Notes Skin Lump or Localized Swelling [1] Swelling is red AND [2] size > 2 inches (5.0 cm) (Exception: itchy area of skin) Final Disposition User See Physician within 4 Hours (or PCP triage) Amber Elizabethrumbull, RN, Amber Shelton Comments Amber Shelton plans to go to an urgent care facility near her home to decrease the anxiety of driving too far from home. Disagree/Comply: Comply
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
## 2016-02-14 NOTE — Patient Instructions (Addendum)
Knee Effusion Knee effusion means that you have excess fluid in your knee joint. This can cause pain and swelling in your knee. This may make your knee more difficult to bend and move. That is because there is increased pain and pressure in the joint. If there is fluid in your knee, it often means that something is wrong inside your knee, such as severe arthritis, abnormal inflammation, or an infection. Another common cause of knee effusion is an injury to the knee muscles, ligaments, or cartilage. HOME CARE INSTRUCTIONS  Use crutches as directed by your health care provider.  Wear a knee brace as directed by your health care provider.  Apply ice to the swollen area:  Put ice in a plastic bag.  Place a towel between your skin and the bag.  Leave the ice on for 20 minutes, 2-3 times per day.  Keep your knee raised (elevated) when you are sitting or lying down.  Take medicines only as directed by your health care provider.  Do any rehabilitation or strengthening exercises as directed by your health care provider.  Rest your knee as directed by your health care provider. You may start doing your normal activities again when your health care provider approves.   Keep all follow-up visits as directed by your health care provider. This is important. SEEK MEDICAL CARE IF:  You have ongoing (persistent) pain in your knee. SEEK IMMEDIATE MEDICAL CARE IF:  You have increased swelling or redness of your knee.  You have severe pain in your knee.  You have a fever.   This information is not intended to replace advice given to you by your health care provider. Make sure you discuss any questions you have with your health care provider.   Document Released: 11/28/2003 Document Revised: 09/28/2014 Document Reviewed: 04/23/2014 Elsevier Interactive Patient Education 2016 [employer]    IF you received an x-ray today, you will receive an invoice from [facility]. Please contact  [facility] at [phone] with questions or concerns regarding your invoice.   IF you received labwork today, you will receive an invoice from [facility]. Please contact [facility] at [phone] with questions or concerns regarding your invoice.   Our billing staff will not be able to assist you with questions regarding bills from these companies.  You will be contacted with the lab results as soon as they are available. The fastest way to get your results is to activate your My Chart account. Instructions are located on the last page of this paperwork. If you have not heard from us regarding the results in 2 weeks, please contact this office.

## 2016-02-14 NOTE — Telephone Encounter (Signed)
[facility] was [facility].
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
## 2016-06-23 ENCOUNTER — Encounter: Payer: Self-pay | Admitting: Internal Medicine

## 2016-06-23 ENCOUNTER — Ambulatory Visit (INDEPENDENT_AMBULATORY_CARE_PROVIDER_SITE_OTHER): Payer: BLUE CROSS/BLUE SHIELD | Admitting: Internal Medicine

## 2016-06-23 VITALS — BP 138/80 | HR 63 | Wt 138.0 lb

## 2016-06-23 DIAGNOSIS — M818 Other osteoporosis without current pathological fracture: Secondary | ICD-10-CM | POA: Diagnosis not present

## 2016-06-23 DIAGNOSIS — Z0000 Encounter for general adult medical examination without abnormal findings: Secondary | ICD-10-CM

## 2016-06-23 DIAGNOSIS — F439 Reaction to severe stress, unspecified: Secondary | ICD-10-CM | POA: Diagnosis not present

## 2016-06-23 DIAGNOSIS — I480 Paroxysmal atrial fibrillation: Secondary | ICD-10-CM

## 2016-06-23 DIAGNOSIS — M810 Age-related osteoporosis without current pathological fracture: Secondary | ICD-10-CM | POA: Insufficient documentation

## 2016-06-23 MED ORDER — ALPRAZOLAM 0.5 MG PO TABS: 0.5000 mg | ORAL_TABLET | Freq: Three times a day (TID) | ORAL | 2 refills | Status: DC | PRN

## 2016-06-23 NOTE — Progress Notes (Signed)
Pre visit review using our clinic review tool, if applicable. No additional management support is needed unless otherwise documented below in the visit note. 

## 2016-06-23 NOTE — Assessment & Plan Note (Signed)
On Flecanide, Metoprolol

## 2016-06-23 NOTE — Assessment & Plan Note (Signed)
Chronic On Vit D, exersie

## 2016-06-23 NOTE — Assessment & Plan Note (Signed)
Xanax prn  Potential benefits of a long term benzodiazepines  use as well as potential risks  and complications were explained to the patient and were aknowledged. 

## 2016-06-23 NOTE — Assessment & Plan Note (Signed)
Vit D 

## 2016-06-23 NOTE — Progress Notes (Signed)
Subjective:  Patient ID: Amber Shelton, female    DOB: 08-11-1951  Age: 65 y.o. MRN: [id number]  CC: No chief complaint on file.   HPI [name] presents for anxiety,   Outpatient Medications Prior to Visit  Medication Sig Dispense Refill  . ALPRAZolam (XANAX) 0.5 MG tablet Take 1 tablet (0.5 mg total) by mouth 3 (three) times daily as needed for anxiety. 90 tablet 2  . calcium carbonate (OS-CAL) 600 MG TABS Take 600 mg by mouth 2 (two) times daily with a meal. Reported on 02/14/2016    . flecainide (TAMBOCOR) 50 MG tablet Take 0.5 tablets (25 mg total) by mouth 2 (two) times daily. (Patient taking differently: Take 25 mg by mouth 2 (two) times daily. 1/2 tablet in AM, 1 tablet in PM) 60 tablet 11  . furosemide (LASIX) 20 MG tablet Take 0.5-1 tablets (10-20 mg total) by mouth daily as needed for edema. 30 tablet 3  . metoprolol tartrate (LOPRESSOR) 25 MG tablet Take 25 mg by mouth 2 (two) times daily.     . multivitamin (POLY-VITAMINS) CHEW Chew 1 tablet by mouth daily.      No facility-administered medications prior to visit.     ROS Review of Systems  Constitutional: Negative for activity change, appetite change, chills, fatigue and unexpected weight change.  HENT: Negative for congestion, mouth sores and sinus pressure.   Eyes: Negative for visual disturbance.  Respiratory: Negative for cough and chest tightness.   Gastrointestinal: Negative for abdominal pain and nausea.  Genitourinary: Negative for difficulty urinating, frequency and vaginal pain.  Musculoskeletal: Negative for back pain and gait problem.  Skin: Negative for pallor and rash.  Neurological: Negative for dizziness, tremors, weakness, numbness and headaches.  Psychiatric/Behavioral: Negative for confusion and sleep disturbance. The patient is nervous/anxious.     Objective:  BP 138/80   Pulse 63   Wt 138 lb (62.6 kg)   SpO2 98%   BMI 25.65 kg/m   BP Readings from Last 3 Encounters:  06/23/16  138/80  02/14/16 122/72  12/24/15 130/75    Wt Readings from Last 3 Encounters:  06/23/16 138 lb (62.6 kg)  02/14/16 138 lb (62.6 kg)  12/24/15 139 lb (63 kg)    Physical Exam  Constitutional: She appears well-developed. No distress.  HENT:  Head: Normocephalic.  Right Ear: External ear normal.  Left Ear: External ear normal.  Nose: Nose normal.  Mouth/Throat: Oropharynx is clear and moist.  Eyes: Conjunctivae are normal. Pupils are equal, round, and reactive to light. Right eye exhibits no discharge. Left eye exhibits no discharge.  Neck: Normal range of motion. Neck supple. No JVD present. No tracheal deviation present. No thyromegaly present.  Cardiovascular: Normal rate, regular rhythm and normal heart sounds.   Pulmonary/Chest: No stridor. No respiratory distress. She has no wheezes.  Abdominal: Soft. Bowel sounds are normal. She exhibits no distension and no mass. There is no tenderness. There is no rebound and no guarding.  Musculoskeletal: She exhibits no edema or tenderness.  Lymphadenopathy:    She has no cervical adenopathy.  Neurological: She displays normal reflexes. No cranial nerve deficit. She exhibits normal muscle tone. Coordination normal.  Skin: No rash noted. No erythema.  Psychiatric: She has a normal mood and affect. Her behavior is normal. Judgment and thought content normal.    Lab Results  Component Value Date   WBC 10.0 02/14/2016   HGB 14.1 02/14/2016   HCT 39.7 02/14/2016   PLT 237.0 08/27/2015  GLUCOSE 93 08/27/2015   CHOL 190 08/27/2015   TRIG 180.0 (H) 08/27/2015   HDL 42.20 08/27/2015   LDLDIRECT 120.1 07/22/2010   LDLCALC 112 (H) 08/27/2015   ALT 39 (H) 08/27/2015   AST 30 08/27/2015   NA 136 08/27/2015   K 4.3 08/27/2015   CL 99 08/27/2015   CREATININE 0.90 08/27/2015   BUN 16 08/27/2015   CO2 31 08/27/2015   TSH 3.68 08/27/2015   HGBA1C 5.4 12/10/2013    [place] Abdomen Complete  Result Date: 06/12/2014 CLINICAL DATA:   Abdominal pain. EXAM: ULTRASOUND ABDOMEN COMPLETE COMPARISON:  None. FINDINGS: Gallbladder: No gallstones or wall thickening visualized. No sonographic Murphy sign noted. Common bile duct: Diameter: 3.1 mm which is within normal limits. Liver: No focal lesion identified. Within normal limits in parenchymal echogenicity. IVC: No abnormality visualized. Pancreas: Visualized portion unremarkable. Spleen: Size and appearance within normal limits. Right Kidney: Length: 11.2 cm. 9 mm simple cyst is noted in the midpole. Echogenicity within normal limits. No mass or hydronephrosis visualized. Left Kidney: Length: 11.4 cm. Echogenicity within normal limits. No mass or hydronephrosis visualized. Abdominal aorta: Atherosclerotic disease is noted, but no aneurysm is seen. Other findings: None. IMPRESSION: No significant abnormality seen in the abdomen. Electronically Signed   By: Roque LiasJames  Green M.D.   On: 06/12/2014 09:10    Assessment & Plan:   There are no diagnoses linked to this encounter. I am having Ms. Roselee Novaltman maintain her calcium carbonate, multivitamin, metoprolol tartrate, flecainide, furosemide, and ALPRAZolam.  No orders of the defined types were placed in this encounter.    Follow-up: No Follow-up on file.  Sonda PrimesAlex Faust Thorington, MD

## 2016-12-22 ENCOUNTER — Other Ambulatory Visit (INDEPENDENT_AMBULATORY_CARE_PROVIDER_SITE_OTHER): Payer: 59

## 2016-12-22 DIAGNOSIS — R7989 Other specified abnormal findings of blood chemistry: Secondary | ICD-10-CM | POA: Diagnosis not present

## 2016-12-22 DIAGNOSIS — Z0000 Encounter for general adult medical examination without abnormal findings: Secondary | ICD-10-CM | POA: Diagnosis not present

## 2016-12-22 LAB — CBC WITH DIFFERENTIAL/PLATELET
BASOS PCT: 1.3 % (ref 0.0–3.0)
Basophils Absolute: 0.1 10*3/uL (ref 0.0–0.1)
EOS PCT: 2.9 % (ref 0.0–5.0)
Eosinophils Absolute: 0.3 10*3/uL (ref 0.0–0.7)
HCT: 44.4 % (ref 36.0–46.0)
Hemoglobin: 15 g/dL (ref 12.0–15.0)
LYMPHS ABS: 3.9 10*3/uL (ref 0.7–4.0)
Lymphocytes Relative: 43.4 % (ref 12.0–46.0)
MCHC: 33.7 g/dL (ref 30.0–36.0)
MCV: 91.3 fl (ref 78.0–100.0)
MONOS PCT: 6.1 % (ref 3.0–12.0)
Monocytes Absolute: 0.5 10*3/uL (ref 0.1–1.0)
NEUTROS ABS: 4.2 10*3/uL (ref 1.4–7.7)
NEUTROS PCT: 46.3 % (ref 43.0–77.0)
Platelets: 264 10*3/uL (ref 150.0–400.0)
RBC: 4.86 Mil/uL (ref 3.87–5.11)
RDW: 13.4 % (ref 11.5–15.5)
WBC: 9 10*3/uL (ref 4.0–10.5)

## 2016-12-22 LAB — URINALYSIS
BILIRUBIN URINE: NEGATIVE
Ketones, ur: NEGATIVE
LEUKOCYTES UA: NEGATIVE
NITRITE: NEGATIVE
PH: 6 (ref 5.0–8.0)
Specific Gravity, Urine: 1.015 (ref 1.000–1.030)
TOTAL PROTEIN, URINE-UPE24: NEGATIVE
Urine Glucose: NEGATIVE
Urobilinogen, UA: 0.2 (ref 0.0–1.0)

## 2016-12-22 LAB — HEPATIC FUNCTION PANEL
ALBUMIN: 4.3 g/dL (ref 3.5–5.2)
ALT: 22 U/L (ref 0–35)
AST: 24 U/L (ref 0–37)
Alkaline Phosphatase: 83 U/L (ref 39–117)
BILIRUBIN TOTAL: 1 mg/dL (ref 0.2–1.2)
Bilirubin, Direct: 0.1 mg/dL (ref 0.0–0.3)
Total Protein: 7.2 g/dL (ref 6.0–8.3)

## 2016-12-22 LAB — BASIC METABOLIC PANEL
BUN: 17 mg/dL (ref 6–23)
CHLORIDE: 100 meq/L (ref 96–112)
CO2: 32 meq/L (ref 19–32)
Calcium: 9.9 mg/dL (ref 8.4–10.5)
Creatinine, Ser: 0.99 mg/dL (ref 0.40–1.20)
GFR: 59.86 mL/min — ABNORMAL LOW (ref >60.00)
GLUCOSE: 104 mg/dL — AB (ref 70–99)
POTASSIUM: 4.9 meq/L (ref 3.5–5.1)
SODIUM: 137 meq/L (ref 135–145)

## 2016-12-22 LAB — LIPID PANEL
CHOLESTEROL: 221 mg/dL — AB (ref 0–200)
HDL: 53.6 mg/dL (ref >39.00)
NonHDL: 167.6
Total CHOL/HDL Ratio: 4
Triglycerides: 221 mg/dL — ABNORMAL HIGH (ref 0.0–149.0)
VLDL: 44.2 mg/dL — AB (ref 0.0–40.0)

## 2016-12-22 LAB — HEPATITIS C ANTIBODY: HCV AB: NEGATIVE

## 2016-12-22 LAB — LDL CHOLESTEROL, DIRECT: Direct LDL: 130 mg/dL

## 2016-12-22 LAB — TSH: TSH: 3.5 u[IU]/mL (ref 0.35–4.50)

## 2016-12-25 ENCOUNTER — Ambulatory Visit (INDEPENDENT_AMBULATORY_CARE_PROVIDER_SITE_OTHER): Payer: 59 | Admitting: Internal Medicine

## 2016-12-25 ENCOUNTER — Encounter: Payer: Self-pay | Admitting: Internal Medicine

## 2016-12-25 ENCOUNTER — Ambulatory Visit (INDEPENDENT_AMBULATORY_CARE_PROVIDER_SITE_OTHER)
Admission: RE | Admit: 2016-12-25 | Discharge: 2016-12-25 | Disposition: A | Discharge status: Home / Self Care | Payer: 59 | Source: Ambulatory Visit | Attending: Internal Medicine | Admitting: Internal Medicine

## 2016-12-25 VITALS — BP 118/72 | HR 56 | Temp 97.6°F | Ht 61.5 in | Wt 139.0 lb

## 2016-12-25 DIAGNOSIS — Z0000 Encounter for general adult medical examination without abnormal findings: Secondary | ICD-10-CM

## 2016-12-25 DIAGNOSIS — N959 Unspecified menopausal and perimenopausal disorder: Secondary | ICD-10-CM | POA: Diagnosis not present

## 2016-12-25 DIAGNOSIS — E785 Hyperlipidemia, unspecified: Secondary | ICD-10-CM

## 2016-12-25 DIAGNOSIS — F439 Reaction to severe stress, unspecified: Secondary | ICD-10-CM | POA: Diagnosis not present

## 2016-12-25 DIAGNOSIS — I480 Paroxysmal atrial fibrillation: Secondary | ICD-10-CM

## 2016-12-25 MED ORDER — ALPRAZOLAM 0.5 MG PO TABS: 0.5000 mg | ORAL_TABLET | Freq: Three times a day (TID) | ORAL | 2 refills | Status: DC | PRN

## 2016-12-25 NOTE — Assessment & Plan Note (Addendum)
Mild Discussed - diet, wine - adjust down the consumption

## 2016-12-25 NOTE — Assessment & Plan Note (Signed)
Discussed Mom w/dementia

## 2016-12-25 NOTE — Progress Notes (Signed)
Subjective:  Patient ID: Amber Shelton, female    DOB: 1951-03-08  Age: 66 y.o. MRN: [id number]  CC: No chief complaint on file.   HPI [name] presents for well exam  Outpatient Medications Prior to Visit  Medication Sig Dispense Refill  . ALPRAZolam (XANAX) 0.5 MG tablet Take 1 tablet (0.5 mg total) by mouth 3 (three) times daily as needed for anxiety. 90 tablet 2  . calcium carbonate (OS-CAL) 600 MG TABS Take 600 mg by mouth 2 (two) times daily with a meal. Reported on 02/14/2016    . flecainide (TAMBOCOR) 50 MG tablet Take 0.5 tablets (25 mg total) by mouth 2 (two) times daily. (Patient taking differently: Take 25 mg by mouth 2 (two) times daily. 1/2 tablet in AM, 1 tablet in PM) 60 tablet 11  . furosemide (LASIX) 20 MG tablet Take 0.5-1 tablets (10-20 mg total) by mouth daily as needed for edema. 30 tablet 3  . metoprolol tartrate (LOPRESSOR) 25 MG tablet Take 25 mg by mouth 2 (two) times daily.     . multivitamin (POLY-VITAMINS) CHEW Chew 1 tablet by mouth daily.      No facility-administered medications prior to visit.     ROS Review of Systems  Constitutional: Negative for activity change, appetite change, chills, fatigue and unexpected weight change.  HENT: Negative for congestion, mouth sores and sinus pressure.   Eyes: Negative for visual disturbance.  Respiratory: Negative for cough and chest tightness.   Gastrointestinal: Negative for abdominal pain and nausea.  Genitourinary: Negative for difficulty urinating, frequency and vaginal pain.  Musculoskeletal: Negative for back pain and gait problem.  Skin: Negative for pallor and rash.  Neurological: Negative for dizziness, tremors, weakness, numbness and headaches.  Psychiatric/Behavioral: Negative for confusion and sleep disturbance.    Objective:  BP 118/72   Pulse (!) 56   Temp 97.6 F (36.4 C)   Ht 5' 1.5" (1.562 m)   Wt 139 lb (63 kg)   SpO2 95%   BMI 25.84 kg/m   BP Readings from Last 3  Encounters:  12/25/16 118/72  06/23/16 138/80  02/14/16 122/72    Wt Readings from Last 3 Encounters:  12/25/16 139 lb (63 kg)  06/23/16 138 lb (62.6 kg)  02/14/16 138 lb (62.6 kg)    Physical Exam  Constitutional: She appears well-developed. No distress.  HENT:  Head: Normocephalic.  Right Ear: External ear normal.  Left Ear: External ear normal.  Nose: Nose normal.  Mouth/Throat: Oropharynx is clear and moist.  Eyes: Conjunctivae are normal. Pupils are equal, round, and reactive to light. Right eye exhibits no discharge. Left eye exhibits no discharge.  Neck: Normal range of motion. Neck supple. No JVD present. No tracheal deviation present. No thyromegaly present.  Cardiovascular: Normal rate, regular rhythm and normal heart sounds.   Pulmonary/Chest: No stridor. No respiratory distress. She has no wheezes.  Abdominal: Soft. Bowel sounds are normal. She exhibits no distension and no mass. There is no tenderness. There is no rebound and no guarding.  Musculoskeletal: She exhibits no edema or tenderness.  Lymphadenopathy:    She has no cervical adenopathy.  Neurological: She displays normal reflexes. No cranial nerve deficit. She exhibits normal muscle tone. Coordination normal.  Skin: No rash noted. No erythema.  Psychiatric: She has a normal mood and affect. Her behavior is normal. Judgment and thought content normal.    Lab Results  Component Value Date   WBC 9.0 12/22/2016   HGB 15.0 12/22/2016  HCT 44.4 12/22/2016   PLT 264.0 12/22/2016   GLUCOSE 104 (H) 12/22/2016   CHOL 221 (H) 12/22/2016   TRIG 221.0 (H) 12/22/2016   HDL 53.60 12/22/2016   LDLDIRECT 130.0 12/22/2016   LDLCALC 112 (H) 08/27/2015   ALT 22 12/22/2016   AST 24 12/22/2016   NA 137 12/22/2016   K 4.9 12/22/2016   CL 100 12/22/2016   CREATININE 0.99 12/22/2016   BUN 17 12/22/2016   CO2 32 12/22/2016   TSH 3.50 12/22/2016   HGBA1C 5.4 12/10/2013    US Abdomen Complete  Result Date:  06/12/2014 CLINICAL DATA:  Abdominal pain. EXAM: ULTRASOUND ABDOMEN COMPLETE COMPARISON:  None. FINDINGS: Gallbladder: No gallstones or wall thickening visualized. No sonographic Murphy sign noted. Common bile duct: Diameter: 3.1 mm which is within normal limits. Liver: No focal lesion identified. Within normal limits in parenchymal echogenicity. IVC: No abnormality visualized. Pancreas: Visualized portion unremarkable. Spleen: Size and appearance within normal limits. Right Kidney: Length: 11.2 cm. 9 mm simple cyst is noted in the midpole. Echogenicity within normal limits. No mass or hydronephrosis visualized. Left Kidney: Length: 11.4 cm. Echogenicity within normal limits. No mass or hydronephrosis visualized. Abdominal aorta: Atherosclerotic disease is noted, but no aneurysm is seen. Other findings: None. IMPRESSION: No significant abnormality seen in the abdomen. Electronically Signed   By: [name] M.D.   On: 06/12/2014 09:10    Assessment & Plan:   There are no diagnoses linked to this encounter. I am having Ms. Tullo maintain her calcium carbonate, multivitamin, metoprolol tartrate, flecainide, furosemide, and ALPRAZolam.  No orders of the defined types were placed in this encounter.    Follow-up: No Follow-up on file.  Sonda Primes, MD

## 2016-12-25 NOTE — Assessment & Plan Note (Addendum)
We discussed age appropriate health related issues, including available/recomended screening tests and vaccinations (declined all). We discussed a need for adhering to healthy diet and exercise. Labs/EKG were reviewed/ordered. All questions were answered. GYN , mammo - pt will schedule Colon due 2023 Stress w/mom - dementia DEXA

## 2016-12-25 NOTE — Assessment & Plan Note (Signed)
On Flecanide, Metoprolol 

## 2017-01-14 ENCOUNTER — Encounter: Payer: Self-pay | Admitting: Internal Medicine

## 2017-01-14 NOTE — Progress Notes (Signed)
Abstracted result and sent to scan  

## 2017-02-19 DIAGNOSIS — J019 Acute sinusitis, unspecified: Secondary | ICD-10-CM | POA: Diagnosis not present

## 2017-06-29 ENCOUNTER — Ambulatory Visit (INDEPENDENT_AMBULATORY_CARE_PROVIDER_SITE_OTHER): Payer: Medicare HMO | Admitting: Internal Medicine

## 2017-06-29 ENCOUNTER — Encounter: Payer: Self-pay | Admitting: Internal Medicine

## 2017-06-29 DIAGNOSIS — R609 Edema, unspecified: Secondary | ICD-10-CM

## 2017-06-29 DIAGNOSIS — I480 Paroxysmal atrial fibrillation: Secondary | ICD-10-CM | POA: Diagnosis not present

## 2017-06-29 DIAGNOSIS — R69 Illness, unspecified: Secondary | ICD-10-CM | POA: Diagnosis not present

## 2017-06-29 DIAGNOSIS — F439 Reaction to severe stress, unspecified: Secondary | ICD-10-CM | POA: Diagnosis not present

## 2017-06-29 MED ORDER — ALPRAZOLAM 0.5 MG PO TABS: 0.5000 mg | ORAL_TABLET | Freq: Three times a day (TID) | ORAL | 2 refills | Status: DC | PRN

## 2017-06-29 MED ORDER — FUROSEMIDE 20 MG PO TABS: 10.0000 mg | ORAL_TABLET | Freq: Every day | ORAL | 3 refills | Status: DC | PRN

## 2017-06-29 NOTE — Assessment & Plan Note (Signed)
Mom died in May 16, 2023

## 2017-06-29 NOTE — Progress Notes (Signed)
Subjective:  Patient ID: Amber Shelton, female    DOB: Dec 31, 1950  Age: 66 y.o. MRN: [id number]  CC: No chief complaint on file.   HPI Amber Shelton presents for a 6 mo f/u for anxiety, HTN , tachycardia (PAF)  Outpatient Medications Prior to Visit  Medication Sig Dispense Refill  . ALPRAZolam (XANAX) 0.5 MG tablet Take 1 tablet (0.5 mg total) by mouth 3 (three) times daily as needed for anxiety. 90 tablet 2  . calcium carbonate (OS-CAL) 600 MG TABS Take 600 mg by mouth 2 (two) times daily with a meal. Reported on 02/14/2016    . flecainide (TAMBOCOR) 50 MG tablet Take 0.5 tablets (25 mg total) by mouth 2 (two) times daily. (Patient taking differently: Take 25 mg by mouth 2 (two) times daily. 1/2 tablet in AM, 1 tablet in PM) 60 tablet 11  . furosemide (LASIX) 20 MG tablet Take 0.5-1 tablets (10-20 mg total) by mouth daily as needed for edema. 30 tablet 3  . metoprolol tartrate (LOPRESSOR) 25 MG tablet Take 25 mg by mouth 2 (two) times daily.     . multivitamin (POLY-VITAMINS) CHEW Chew 1 tablet by mouth daily.      No facility-administered medications prior to visit.     ROS Review of Systems  Constitutional: Negative for activity change, appetite change, chills, fatigue and unexpected weight change.  HENT: Negative for congestion, mouth sores and sinus pressure.   Eyes: Negative for visual disturbance.  Respiratory: Negative for cough and chest tightness.   Gastrointestinal: Negative for abdominal pain and nausea.  Genitourinary: Negative for difficulty urinating, frequency and vaginal pain.  Musculoskeletal: Negative for back pain and gait problem.  Skin: Negative for pallor and rash.  Neurological: Negative for dizziness, tremors, weakness, numbness and headaches.  Psychiatric/Behavioral: Negative for confusion and sleep disturbance. The patient is nervous/anxious.     Objective:  BP 124/72 (BP Location: Left Arm, Patient Position: Sitting, Cuff Size: Normal)   Pulse (!)  56   Temp 98.4 F (36.9 C) (Oral)   Ht 5' 1.5" (1.562 m)   Wt 134 lb (60.8 kg)   SpO2 99%   BMI 24.91 kg/m   BP Readings from Last 3 Encounters:  06/29/17 124/72  12/25/16 118/72  06/23/16 138/80    Wt Readings from Last 3 Encounters:  06/29/17 134 lb (60.8 kg)  12/25/16 139 lb (63 kg)  06/23/16 138 lb (62.6 kg)    Physical Exam  Constitutional: She appears well-developed. No distress.  HENT:  Head: Normocephalic.  Right Ear: External ear normal.  Left Ear: External ear normal.  Nose: Nose normal.  Mouth/Throat: Oropharynx is clear and moist.  Eyes: Pupils are equal, round, and reactive to light. Conjunctivae are normal. Right eye exhibits no discharge. Left eye exhibits no discharge.  Neck: Normal range of motion. Neck supple. No JVD present. No tracheal deviation present. No thyromegaly present.  Cardiovascular: Normal rate, regular rhythm and normal heart sounds.   Pulmonary/Chest: No stridor. No respiratory distress. She has no wheezes.  Abdominal: Soft. Bowel sounds are normal. She exhibits no distension and no mass. There is no tenderness. There is no rebound and no guarding.  Musculoskeletal: She exhibits no edema or tenderness.  Lymphadenopathy:    She has no cervical adenopathy.  Neurological: She displays normal reflexes. No cranial nerve deficit. She exhibits normal muscle tone. Coordination normal.  Skin: No rash noted. No erythema.  Psychiatric: She has a normal mood and affect. Her behavior is normal.  Judgment and thought content normal.    Lab Results  Component Value Date   WBC 9.0 12/22/2016   HGB 15.0 12/22/2016   HCT 44.4 12/22/2016   PLT 264.0 12/22/2016   GLUCOSE 104 (H) 12/22/2016   CHOL 221 (H) 12/22/2016   TRIG 221.0 (H) 12/22/2016   HDL 53.60 12/22/2016   LDLDIRECT 130.0 12/22/2016   LDLCALC 112 (H) 08/27/2015   ALT 22 12/22/2016   AST 24 12/22/2016   NA 137 12/22/2016   K 4.9 12/22/2016   CL 100 12/22/2016   CREATININE 0.99  12/22/2016   BUN 17 12/22/2016   CO2 32 12/22/2016   TSH 3.50 12/22/2016   HGBA1C 5.4 12/10/2013    Dg Bone Density (dxa)  Result Date: 12/27/2016 Date of study: 12/25/16 Exam: DUAL X-RAY ABSORPTIOMETRY (DXA) FOR BONE MINERAL DENSITY (BMD) Instrument: [employer] Therapist, art Provider: PCP Indication: screening for osteoporosis Comparison: none (please note that it is not possible to compare data from different instruments) Clinical data: Pt is a 66 y.o. female without history of fracture. Results:  Lumbar spine (L1-L4) Femoral neck (FN) 33% distal radius T-score - 3.5 RFN: - 2.4 LFN: - 2.5 n/a Change in BMD from previous DXA test (%) Up 2.0% Up 0.4% n/a (*) statistically significant Assessment: Patient has OSTEOPOROSIS according to the [facility] classification for osteoporosis (see below). Fracture risk: high Comments: the technical quality of the study is good Evaluation for secondary causes should be considered if clinically indicated. Recommend optimizing calcium (1200 mg/day) and vitamin D (800 IU/day). Followup: Repeat BMD is appropriate after 2 years or after 1-2 years if starting treatment. WHO criteria for diagnosis of osteoporosis in postmenopausal women and in men 96 y/o or older: - normal: T-score -1.0 to + 1.0 - osteopenia/low bone density: T-score between -2.5 and -1.0 - osteoporosis: T-score below -2.5 - severe osteoporosis: T-score below -2.5 with history of fragility fracture Note: although not part of the WHO classification, the presence of a fragility fracture, regardless of the T-score, should be considered diagnostic of osteoporosis, provided other causes for the fracture have been excluded. Treatment: The National Osteoporosis Foundation recommends that treatment be considered in postmenopausal women and men age 64 or older with: 1. Hip or vertebral (clinical or morphometric) fracture 2. T-score of - 2.5 or lower at the spine or hip 3. 10-year fracture probability by FRAX of at least 20%  for a major osteoporotic fracture and 3% for a hip fracture Amber Manns MD    Assessment & Plan:   There are no diagnoses linked to this encounter. I am having [name] maintain her calcium carbonate, multivitamin, metoprolol tartrate, flecainide, furosemide, and ALPRAZolam.  No orders of the defined types were placed in this encounter.    Follow-up: No Follow-up on file.  Sonda Primes, MD

## 2017-06-29 NOTE — Assessment & Plan Note (Signed)
CHA2DS2VASC score 1 On Flecanide, Metoprolol 

## 2017-06-29 NOTE — Assessment & Plan Note (Signed)
Lasix prn rare ?

## 2017-07-23 DIAGNOSIS — M545 Low back pain: Secondary | ICD-10-CM | POA: Diagnosis not present

## 2017-07-24 ENCOUNTER — Encounter: Payer: Self-pay | Admitting: Family Medicine

## 2017-07-24 ENCOUNTER — Ambulatory Visit (INDEPENDENT_AMBULATORY_CARE_PROVIDER_SITE_OTHER): Payer: Medicare HMO | Admitting: Family Medicine

## 2017-07-24 VITALS — BP 146/76 | HR 60 | Temp 97.8°F | Ht 61.5 in | Wt 134.0 lb

## 2017-07-24 DIAGNOSIS — M62830 Muscle spasm of back: Secondary | ICD-10-CM

## 2017-07-24 DIAGNOSIS — R000 Tachycardia, unspecified: Secondary | ICD-10-CM | POA: Diagnosis not present

## 2017-07-24 MED ORDER — CYCLOBENZAPRINE HCL 5 MG PO TABS: ORAL_TABLET | ORAL | 0 refills | Status: DC
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
## 2017-07-24 NOTE — Patient Instructions (Signed)
I think you are having muscle spasms of your lower back.  Please try the flexeril as needed- also heat, ?masage, gentle movement may be helpful. Please let us know if you are not feeling better in the next few days The flexeril is a muscle relaxer and may cause drowsiness

## 2017-08-01 ENCOUNTER — Encounter: Payer: Self-pay | Admitting: Internal Medicine

## 2017-08-27 DIAGNOSIS — I872 Venous insufficiency (chronic) (peripheral): Secondary | ICD-10-CM | POA: Diagnosis not present

## 2017-08-27 DIAGNOSIS — I480 Paroxysmal atrial fibrillation: Secondary | ICD-10-CM | POA: Diagnosis not present

## 2017-10-05 DIAGNOSIS — Z1389 Encounter for screening for other disorder: Secondary | ICD-10-CM | POA: Diagnosis not present

## 2017-10-05 DIAGNOSIS — Z01419 Encounter for gynecological examination (general) (routine) without abnormal findings: Secondary | ICD-10-CM | POA: Diagnosis not present

## 2017-10-05 DIAGNOSIS — Z124 Encounter for screening for malignant neoplasm of cervix: Secondary | ICD-10-CM | POA: Diagnosis not present

## 2017-10-05 DIAGNOSIS — N951 Menopausal and female climacteric states: Secondary | ICD-10-CM | POA: Diagnosis not present

## 2017-10-06 DIAGNOSIS — Z124 Encounter for screening for malignant neoplasm of cervix: Secondary | ICD-10-CM | POA: Diagnosis not present

## 2017-12-28 ENCOUNTER — Encounter: Payer: Self-pay | Admitting: Internal Medicine

## 2017-12-28 ENCOUNTER — Ambulatory Visit (INDEPENDENT_AMBULATORY_CARE_PROVIDER_SITE_OTHER): Payer: Medicare HMO | Admitting: Internal Medicine

## 2017-12-28 VITALS — BP 116/68 | HR 60 | Temp 98.5°F | Ht 61.5 in | Wt 136.0 lb

## 2017-12-28 DIAGNOSIS — E785 Hyperlipidemia, unspecified: Secondary | ICD-10-CM

## 2017-12-28 DIAGNOSIS — F419 Anxiety disorder, unspecified: Secondary | ICD-10-CM | POA: Diagnosis not present

## 2017-12-28 DIAGNOSIS — Z0000 Encounter for general adult medical examination without abnormal findings: Secondary | ICD-10-CM

## 2017-12-28 DIAGNOSIS — R69 Illness, unspecified: Secondary | ICD-10-CM | POA: Diagnosis not present

## 2017-12-28 MED ORDER — ALPRAZOLAM 0.5 MG PO TABS: 0.5000 mg | ORAL_TABLET | Freq: Three times a day (TID) | ORAL | 3 refills | Status: DC | PRN

## 2017-12-28 NOTE — Assessment & Plan Note (Signed)
Xanax prn  Potential benefits of a long term benzodiazepines  use as well as potential risks  and complications were explained to the patient and were aknowledged. 

## 2017-12-28 NOTE — Patient Instructions (Signed)
Health Maintenance for Postmenopausal Women Menopause is a normal process in which your reproductive ability comes to an end. This process happens gradually over a span of months to years, usually between the ages of 22 and 9. Menopause is complete when you have missed 12 consecutive menstrual periods. It is important to talk with your health care provider about some of the most common conditions that affect postmenopausal women, such as heart disease, cancer, and bone loss (osteoporosis). Adopting a healthy lifestyle and getting preventive care can help to promote your health and wellness. Those actions can also lower your chances of developing some of these common conditions. What should I know about menopause? During menopause, you may experience a number of symptoms, such as:  Moderate-to-severe hot flashes.  Night sweats.  Decrease in sex drive.  Mood swings.  Headaches.  Tiredness.  Irritability.  Memory problems.  Insomnia.  Choosing to treat or not to treat menopausal changes is an individual decision that you make with your health care provider. What should I know about hormone replacement therapy and supplements? Hormone therapy products are effective for treating symptoms that are associated with menopause, such as hot flashes and night sweats. Hormone replacement carries certain risks, especially as you become older. If you are thinking about using estrogen or estrogen with progestin treatments, discuss the benefits and risks with your health care provider. What should I know about heart disease and stroke? Heart disease, heart attack, and stroke become more likely as you age. This may be due, in part, to the hormonal changes that your body experiences during menopause. These can affect how your body processes dietary fats, triglycerides, and cholesterol. Heart attack and stroke are both medical emergencies. There are many things that you can do to help prevent heart disease  and stroke:  Have your blood pressure checked at least every 1-2 years. High blood pressure causes heart disease and increases the risk of stroke.  If you are 53-22 years old, ask your health care provider if you should take aspirin to prevent a heart attack or a stroke.  Do not use any tobacco products, including cigarettes, chewing tobacco, or electronic cigarettes. If you need help quitting, ask your health care provider.  It is important to eat a healthy diet and maintain a healthy weight. ? Be sure to include plenty of vegetables, fruits, low-fat dairy products, and lean protein. ? Avoid eating foods that are high in solid fats, added sugars, or salt (sodium).  Get regular exercise. This is one of the most important things that you can do for your health. ? Try to exercise for at least 150 minutes each week. The type of exercise that you do should increase your heart rate and make you sweat. This is known as moderate-intensity exercise. ? Try to do strengthening exercises at least twice each week. Do these in addition to the moderate-intensity exercise.  Know your numbers.Ask your health care provider to check your cholesterol and your blood glucose. Continue to have your blood tested as directed by your health care provider.  What should I know about cancer screening? There are several types of cancer. Take the following steps to reduce your risk and to catch any cancer development as early as possible. Breast Cancer  Practice breast self-awareness. ? This means understanding how your breasts normally appear and feel. ? It also means doing regular breast self-exams. Let your health care provider know about any changes, no matter how small.  If you are 40  or older, have a clinician do a breast exam (clinical breast exam or CBE) every year. Depending on your age, family history, and medical history, it may be recommended that you also have a yearly breast X-ray (mammogram).  If you  have a family history of breast cancer, talk with your health care provider about genetic screening.  If you are at high risk for breast cancer, talk with your health care provider about having an MRI and a mammogram every year.  Breast cancer (BRCA) gene test is recommended for women who have family members with BRCA-related cancers. Results of the assessment will determine the need for genetic counseling and BRCA1 and for BRCA2 testing. BRCA-related cancers include these types: ? Breast. This occurs in males or females. ? Ovarian. ? Tubal. This may also be called fallopian tube cancer. ? Cancer of the abdominal or pelvic lining (peritoneal cancer). ? Prostate. ? Pancreatic.  Cervical, Uterine, and Ovarian Cancer Your health care provider may recommend that you be screened regularly for cancer of the pelvic organs. These include your ovaries, uterus, and vagina. This screening involves a pelvic exam, which includes checking for microscopic changes to the surface of your cervix (Pap test).  For women ages 21-65, health care providers may recommend a pelvic exam and a Pap test every three years. For women ages 79-65, they may recommend the Pap test and pelvic exam, combined with testing for human papilloma virus (HPV), every five years. Some types of HPV increase your risk of cervical cancer. Testing for HPV may also be done on women of any age who have unclear Pap test results.  Other health care providers may not recommend any screening for nonpregnant women who are considered low risk for pelvic cancer and have no symptoms. Ask your health care provider if a screening pelvic exam is right for you.  If you have had past treatment for cervical cancer or a condition that could lead to cancer, you need Pap tests and screening for cancer for at least 20 years after your treatment. If Pap tests have been discontinued for you, your risk factors (such as having a new sexual partner) need to be  reassessed to determine if you should start having screenings again. Some women have medical problems that increase the chance of getting cervical cancer. In these cases, your health care provider may recommend that you have screening and Pap tests more often.  If you have a family history of uterine cancer or ovarian cancer, talk with your health care provider about genetic screening.  If you have vaginal bleeding after reaching menopause, tell your health care provider.  There are currently no reliable tests available to screen for ovarian cancer.  Lung Cancer Lung cancer screening is recommended for adults 69-62 years old who are at high risk for lung cancer because of a history of smoking. A yearly low-dose CT scan of the lungs is recommended if you:  Currently smoke.  Have a history of at least 30 pack-years of smoking and you currently smoke or have quit within the past 15 years. A pack-year is smoking an average of one pack of cigarettes per day for one year.  Yearly screening should:  Continue until it has been 15 years since you quit.  Stop if you develop a health problem that would prevent you from having lung cancer treatment.  Colorectal Cancer  This type of cancer can be detected and can often be prevented.  Routine colorectal cancer screening usually begins at  age 42 and continues through age 45.  If you have risk factors for colon cancer, your health care provider may recommend that you be screened at an earlier age.  If you have a family history of colorectal cancer, talk with your health care provider about genetic screening.  Your health care provider may also recommend using home test kits to check for hidden blood in your stool.  A small camera at the end of a tube can be used to examine your colon directly (sigmoidoscopy or colonoscopy). This is done to check for the earliest forms of colorectal cancer.  Direct examination of the colon should be repeated every  5-10 years until age 71. However, if early forms of precancerous polyps or small growths are found or if you have a family history or genetic risk for colorectal cancer, you may need to be screened more often.  Skin Cancer  Check your skin from head to toe regularly.  Monitor any moles. Be sure to tell your health care provider: ? About any new moles or changes in moles, especially if there is a change in a mole's shape or color. ? If you have a mole that is larger than the size of a pencil eraser.  If any of your family members has a history of skin cancer, especially at a young age, talk with your health care provider about genetic screening.  Always use sunscreen. Apply sunscreen liberally and repeatedly throughout the day.  Whenever you are outside, protect yourself by wearing long sleeves, pants, a wide-brimmed hat, and sunglasses.  What should I know about osteoporosis? Osteoporosis is a condition in which bone destruction happens more quickly than new bone creation. After menopause, you may be at an increased risk for osteoporosis. To help prevent osteoporosis or the bone fractures that can happen because of osteoporosis, the following is recommended:  If you are 46-71 years old, get at least 1,000 mg of calcium and at least 600 mg of vitamin D per day.  If you are older than age 55 but younger than age 65, get at least 1,200 mg of calcium and at least 600 mg of vitamin D per day.  If you are older than age 54, get at least 1,200 mg of calcium and at least 800 mg of vitamin D per day.  Smoking and excessive alcohol intake increase the risk of osteoporosis. Eat foods that are rich in calcium and vitamin D, and do weight-bearing exercises several times each week as directed by your health care provider. What should I know about how menopause affects my mental health? Depression may occur at any age, but it is more common as you become older. Common symptoms of depression  include:  Low or sad mood.  Changes in sleep patterns.  Changes in appetite or eating patterns.  Feeling an overall lack of motivation or enjoyment of activities that you previously enjoyed.  Frequent crying spells.  Talk with your health care provider if you think that you are experiencing depression. What should I know about immunizations? It is important that you get and maintain your immunizations. These include:  Tetanus, diphtheria, and pertussis (Tdap) booster vaccine.  Influenza every year before the flu season begins.  Pneumonia vaccine.  Shingles vaccine.  Your health care provider may also recommend other immunizations. This information is not intended to replace advice given to you by your health care provider. Make sure you discuss any questions you have with your health care provider. Document Released: 10/30/2005  Document Revised: 03/27/2016 Document Reviewed: 06/11/2015 [employer] Interactive Patient Education  2018 [employer]  

## 2017-12-28 NOTE — Progress Notes (Signed)
Subjective:  Patient ID: Amber Shelton, female    DOB: [birth year]  Age: 67 y.o. MRN: [id number]  CC: No chief complaint on file.   HPI [name] [facility]  Outpatient Medications Prior to Visit  Medication Sig Dispense Refill  . ALPRAZolam (XANAX) 0.5 MG tablet Take 1 tablet (0.5 mg total) by mouth 3 (three) times daily as needed for anxiety. 90 tablet 2  . calcium carbonate (OS-CAL) 600 MG TABS Take 600 mg by mouth 2 (two) times daily with a meal. Reported on 02/14/2016    . flecainide (TAMBOCOR) 50 MG tablet Take 0.5 tablets (25 mg total) by mouth 2 (two) times daily. (Patient taking differently: Take 25 mg by mouth 2 (two) times daily. 1/2 tablet in AM, 1 tablet in PM) 60 tablet 11  . furosemide (LASIX) 20 MG tablet Take 0.5-1 tablets (10-20 mg total) by mouth daily as needed for edema. 30 tablet 3  . metoprolol tartrate (LOPRESSOR) 25 MG tablet Take 25 mg by mouth 2 (two) times daily.     . multivitamin (POLY-VITAMINS) CHEW Chew 1 tablet by mouth daily.     . cyclobenzaprine (FLEXERIL) 5 MG tablet Take 1/2 or 1 tablet every 8 hours as needed for muscle spasm 30 tablet 0   No facility-administered medications prior to visit.     ROS Review of Systems  Constitutional: Negative for activity change, appetite change, chills, fatigue and unexpected weight change.  HENT: Negative for congestion, mouth sores and sinus pressure.   Eyes: Negative for visual disturbance.  Respiratory: Negative for cough and chest tightness.   Gastrointestinal: Negative for abdominal pain and nausea.  Genitourinary: Negative for difficulty urinating, frequency and vaginal pain.  Musculoskeletal: Negative for back pain and gait problem.  Skin: Negative for pallor and rash.  Neurological: Negative for dizziness, tremors, weakness, numbness and headaches.  Psychiatric/Behavioral: Negative for confusion and sleep disturbance. The patient is nervous/anxious.     Objective:  BP  116/68 (BP Location: Left Arm, Patient Position: Sitting, Cuff Size: Large)   Pulse 60   Temp 98.5 F (36.9 C) (Oral)   Ht 5' 1.5" (1.562 m)   Wt 136 lb (61.7 kg)   SpO2 98%   BMI 25.28 kg/m   BP Readings from Last 3 Encounters:  12/28/17 116/68  07/24/17 (!) 146/76  06/29/17 124/72    Wt Readings from Last 3 Encounters:  12/28/17 136 lb (61.7 kg)  07/24/17 134 lb (60.8 kg)  06/29/17 134 lb (60.8 kg)    Physical Exam  Constitutional: She appears well-developed. No distress.  HENT:  Head: Normocephalic.  Right Ear: External ear normal.  Left Ear: External ear normal.  Nose: Nose normal.  Mouth/Throat: Oropharynx is clear and moist.  Eyes: Pupils are equal, round, and reactive to light. Conjunctivae are normal. Right eye exhibits no discharge. Left eye exhibits no discharge.  Neck: Normal range of motion. Neck supple. No JVD present. No tracheal deviation present. No thyromegaly present.  Cardiovascular: Normal rate, regular rhythm and normal heart sounds.  Pulmonary/Chest: No stridor. No respiratory distress. She has no wheezes.  Abdominal: Soft. Bowel sounds are normal. She exhibits no distension and no mass. There is no tenderness. There is no rebound and no guarding.  Musculoskeletal: She exhibits no edema or tenderness.  Lymphadenopathy:    She has no cervical adenopathy.  Neurological: She displays normal reflexes. No cranial nerve deficit. She exhibits normal muscle tone. Coordination normal.  Skin: No rash  noted. No erythema.  Psychiatric: She has a normal mood and affect. Her behavior is normal. Judgment and thought content normal.    Lab Results  Component Value Date   WBC 9.0 12/22/2016   HGB 15.0 12/22/2016   HCT 44.4 12/22/2016   PLT 264.0 12/22/2016   GLUCOSE 104 (H) 12/22/2016   CHOL 221 (H) 12/22/2016   TRIG 221.0 (H) 12/22/2016   HDL 53.60 12/22/2016   LDLDIRECT 130.0 12/22/2016   LDLCALC 112 (H) 08/27/2015   ALT 22 12/22/2016   AST 24 12/22/2016    NA 137 12/22/2016   K 4.9 12/22/2016   CL 100 12/22/2016   CREATININE 0.99 12/22/2016   BUN 17 12/22/2016   CO2 32 12/22/2016   TSH 3.50 12/22/2016   HGBA1C 5.4 12/10/2013    Dg Bone Density (dxa)  Result Date: 12/27/2016 Date of study: 12/25/16 Exam: DUAL X-RAY ABSORPTIOMETRY (DXA) FOR BONE MINERAL DENSITY (BMD) Instrument: [employer] Therapist, art Provider: PCP Indication: screening for osteoporosis Comparison: none (please note that it is not possible to compare data from different instruments) Clinical data: Pt is a 67 y.o. female without history of fracture. Results:  Lumbar spine (L1-L4) Femoral neck (FN) 33% distal radius T-score - 3.5 RFN: - 2.4 LFN: - 2.5 n/a Change in BMD from previous DXA test (%) Up 2.0% Up 0.4% n/a (*) statistically significant Assessment: Patient has OSTEOPOROSIS according to the [facility] classification for osteoporosis (see below). Fracture risk: high Comments: the technical quality of the study is good Evaluation for secondary causes should be considered if clinically indicated. Recommend optimizing calcium (1200 mg/day) and vitamin D (800 IU/day). Followup: Repeat BMD is appropriate after 2 years or after 1-2 years if starting treatment. WHO criteria for diagnosis of osteoporosis in postmenopausal women and in men 27 y/o or older: - normal: T-score -1.0 to + 1.0 - osteopenia/low bone density: T-score between -2.5 and -1.0 - osteoporosis: T-score below -2.5 - severe osteoporosis: T-score below -2.5 with history of fragility fracture Note: although not part of the WHO classification, the presence of a fragility fracture, regardless of the T-score, should be considered diagnostic of osteoporosis, provided other causes for the fracture have been excluded. Treatment: The National Osteoporosis Foundation recommends that treatment be considered in postmenopausal women and men age 79 or older with: 1. Hip or vertebral (clinical or morphometric) fracture 2. T-score of - 2.5 or  lower at the spine or hip 3. 10-year fracture probability by FRAX of at least 20% for a major osteoporotic fracture and 3% for a hip fracture Roxy Manns MD    Assessment & Plan:   There are no diagnoses linked to this encounter. I have discontinued Hollis W. Rendall's cyclobenzaprine. I am also having her maintain her calcium carbonate, multivitamin, metoprolol tartrate, flecainide, furosemide, and ALPRAZolam.  No orders of the defined types were placed in this encounter.    Follow-up: No follow-ups on file.  Sonda Primes, MD

## 2017-12-28 NOTE — Assessment & Plan Note (Signed)
Here for medicare wellness/physical  Diet: heart healthy  Physical activity: not sedentary  Depression/mood screen: negative  Hearing: intact to whispered voice  Visual acuity: grossly normal - glasses for driving, performs annual eye exam  ADLs: capable  Fall risk: low to none  Home safety: good  Cognitive evaluation: intact to orientation, naming, recall and repetition  EOL planning: adv directives, full code/ I agree  I have personally reviewed and have noted  1. The patient's medical, surgical and social history  2. Their use of alcohol, tobacco or illicit drugs  3. Their current medications and supplements  4. The patient's functional ability including ADL's, fall risks, home safety risks and hearing or visual impairment.  5. Diet and physical activities  6. Evidence for depression or mood disorders - anxiety 7. The roster of all physicians providing medical care to patient - is listed in the Snapshot section of the chart and reviewed today.    Today patient counseled on age appropriate routine health concerns for screening and prevention, each reviewed and up to date or declined. Immunizations reviewed and up to date or declined. Labs ordered and reviewed. Risk factors for depression reviewed and negative. Hearing function and visual acuity are intact. ADLs screened and addressed as needed. Functional ability and level of safety reviewed and appropriate. Education, counseling and referrals performed based on assessed risks today. Patient provided with a copy of personalized plan for preventive services.   GYN , mammo - pt will schedule Colon due 2023

## 2018-01-11 DIAGNOSIS — Z1231 Encounter for screening mammogram for malignant neoplasm of breast: Secondary | ICD-10-CM | POA: Diagnosis not present

## 2018-01-11 LAB — HM MAMMOGRAPHY

## 2018-01-18 ENCOUNTER — Encounter: Payer: Self-pay | Admitting: Internal Medicine

## 2018-01-18 NOTE — Progress Notes (Signed)
Outside notes received. Information abstracted. Notes sent to scan.  

## 2018-01-25 DIAGNOSIS — H6503 Acute serous otitis media, bilateral: Secondary | ICD-10-CM | POA: Diagnosis not present

## 2018-01-25 DIAGNOSIS — R0982 Postnasal drip: Secondary | ICD-10-CM | POA: Diagnosis not present

## 2018-01-25 DIAGNOSIS — J069 Acute upper respiratory infection, unspecified: Secondary | ICD-10-CM | POA: Diagnosis not present

## 2018-01-29 DIAGNOSIS — H6503 Acute serous otitis media, bilateral: Secondary | ICD-10-CM | POA: Diagnosis not present

## 2018-01-29 DIAGNOSIS — J209 Acute bronchitis, unspecified: Secondary | ICD-10-CM | POA: Diagnosis not present

## 2018-01-29 DIAGNOSIS — J069 Acute upper respiratory infection, unspecified: Secondary | ICD-10-CM | POA: Diagnosis not present

## 2018-01-29 DIAGNOSIS — R0982 Postnasal drip: Secondary | ICD-10-CM | POA: Diagnosis not present

## 2018-03-14 ENCOUNTER — Encounter: Payer: Self-pay | Admitting: Family

## 2018-03-14 ENCOUNTER — Telehealth: Payer: Self-pay | Admitting: Internal Medicine

## 2018-03-14 ENCOUNTER — Ambulatory Visit (INDEPENDENT_AMBULATORY_CARE_PROVIDER_SITE_OTHER): Payer: Medicare HMO | Admitting: Family

## 2018-03-14 VITALS — BP 116/68 | HR 84 | Temp 98.4°F | Ht 61.5 in | Wt 135.1 lb

## 2018-03-14 DIAGNOSIS — K1120 Sialoadenitis, unspecified: Secondary | ICD-10-CM

## 2018-03-14 MED ORDER — AMOXICILLIN-POT CLAVULANATE 125-31.25 MG/5ML PO SUSR: 7.0000 mL | Freq: Two times a day (BID) | ORAL | 0 refills | Status: DC

## 2018-03-14 NOTE — Patient Instructions (Signed)
Parotitis Parotitis means that you have irritation and swelling (inflammation) in one or both of your parotid glands. These glands make spit (saliva). They are found on each side of your face, below and in front of your earlobes. You may or may not have pain with this condition. Follow these instructions at home: Medicines  Take over-the-counter and prescription medicines only as told by your doctor.  If you were prescribed an antibiotic medicine, take it as told by your doctor. Do not stop taking the antibiotic even if you start to feel better. Managing pain and swelling  Apply warm cloths (compresses) to the swollen area as told by your doctor.  Gently rub your parotid glands as told by your doctor. General instructions   Drink enough fluid to keep your pee (urine) clear or pale yellow.  Suck on sour candy. This may help: ? To make your mouth less dry. ? To make more spit.  Keep your mouth clean and moist. ? Gargle with a salt-water mixture 3-4 times per day, or as needed. ? To make a salt-water mixture, stir -1 tsp of salt into 1 cup of warm water.  Take good care of your mouth: ? Brush your teeth at least two times per day. ? Floss your teeth every day. ? See your dentist regularly.  Do not use tobacco products. These include cigarettes, chewing tobacco, or e-cigarettes. If you need help quitting,  ask your doctor.  Keep all follow-up visits as told by your doctor. This is important. Contact a doctor if:  You have a fever or chills.  You have new symptoms.  Your symptoms get worse.  Your symptoms do not get better with treatment. This information is not intended to replace advice given to you by your health care provider. Make sure you discuss any questions you have with your health care provider. Document Released: 10/10/2010 Document Revised: 02/13/2016 Document Reviewed: 01/31/2015 Elsevier Interactive Patient Education  2018 Elsevier Inc.  

## 2018-03-14 NOTE — Telephone Encounter (Signed)
Copied from CRM [id number]. Topic: Quick Communication - See Telephone Encounter >> Mar 14, 2018  3:48 PM Gean BirchwoodWilliams-Neal, Sade R wrote: [facility] [address] - [place], [place] [address] called in and stated they do not have amoxicillin-clavulanate (AUGMENTIN) 125-31.25 MG/5ML suspension in stock but they do have 400/57 pr 5mls or 600/42.9 per 5mls could you call back with verification of which prescription you would like   Callback [phone] (Phone), [phone] (Fax)
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
## 2018-03-15 ENCOUNTER — Other Ambulatory Visit: Payer: Self-pay | Admitting: Family

## 2018-03-15 NOTE — Telephone Encounter (Signed)
Spoke with Thayer Ohmhris today and info given.

## 2018-03-15 NOTE — Telephone Encounter (Signed)
Okay to do the 400 mg suspension; should need 2 tsp ( 10 ml) po bid; #200 ml/ 0 rf;

## 2018-03-16 NOTE — Telephone Encounter (Signed)
Pharmacy called stating that the patient is allergic to amoxicillin so she needs a different [facility].

## 2018-03-16 NOTE — Telephone Encounter (Signed)
Thanks for the information- we do not have that listed here and patient told me that she has taken Augmentin in the past;   I will add that to her list but please make sure the patient is aware that she has had an allergy to Amoxicillin in the past. Can she verify what type of reaction she had?

## 2018-03-17 ENCOUNTER — Telehealth: Payer: Self-pay | Admitting: Internal Medicine

## 2018-03-17 MED ORDER — AZITHROMYCIN 200 MG/5ML PO SUSR: ORAL | 0 refills | Status: DC

## 2018-03-17 NOTE — Telephone Encounter (Signed)
Copied from CRM [id number]. Topic: Quick Communication - See Telephone Encounter >> Mar 17, 2018  3:35 PM Gaynelle AduPoole, Shalonda wrote: CRM for notification. See Telephone encounter for: 03/17/18. [facility] pharmacy is calling in regards to the medication azithromycin (ZITHROMAX) 200 MG/5ML suspension. Stated the instruction are not correct. Please advise

## 2018-03-17 NOTE — Addendum Note (Signed)
Addended by: Eustace MooreMURRAY, Gracelynn Bircher W on: 03/17/2018 03:26 PM   Modules accepted: Orders

## 2018-03-18 ENCOUNTER — Telehealth: Payer: Self-pay | Admitting: Family

## 2018-03-18 ENCOUNTER — Other Ambulatory Visit: Payer: Self-pay | Admitting: Family

## 2018-03-18 MED ORDER — CEFDINIR 125 MG/5ML PO SUSR: 300.0000 mg | Freq: Two times a day (BID) | ORAL | 0 refills | Status: DC

## 2018-03-18 NOTE — Telephone Encounter (Signed)
Yes- stop the Zithromax; [facility];

## 2018-03-18 NOTE — Telephone Encounter (Signed)
Amber Shelton sent in [employer]

## 2018-03-18 NOTE — Telephone Encounter (Signed)
Please advise 

## 2018-03-18 NOTE — Telephone Encounter (Signed)
Spoke with Onalee Huaavid and info given. He had received script for new script so patient is all set.

## 2018-03-18 NOTE — Telephone Encounter (Signed)
Verified with pharmacy that directions are correct; it was an error on pharmacy end.

## 2018-03-18 NOTE — Telephone Encounter (Signed)
Copied from CRM [phone]. Topic: Quick Communication - See Telephone Encounter >> Mar 18, 2018  2:00 PM Arlyss Gandyichardson, Dushawn Pusey N, NT wrote: CRM for notification. See Telephone encounter for: 03/18/18. Onalee Huaavid from [facility] calling to let Ria ClockLaura Murray know that he pt had flecainide (TAMBOCOR) 50 MG tablet filled by a Dr. Ellwood HandlerWilliam Tilley (phone#: [phone]) on 01/12/18 and that medication and the azithromycin (ZITHROMAX) 200 MG/5ML suspension have a drug interaction that can cause prolong rhythm. HE wants to see is Vernona RiegerLaura would like to change the rx to something else and if so to fax the new rx to [phone]. [facility] [address] - [place], [place] [address] [phone] (Phone) [phone] (Fax)

## 2018-06-01 DIAGNOSIS — L03116 Cellulitis of left lower limb: Secondary | ICD-10-CM | POA: Diagnosis not present

## 2018-06-14 ENCOUNTER — Other Ambulatory Visit (INDEPENDENT_AMBULATORY_CARE_PROVIDER_SITE_OTHER): Payer: Medicare HMO

## 2018-06-14 DIAGNOSIS — Z0000 Encounter for general adult medical examination without abnormal findings: Secondary | ICD-10-CM

## 2018-06-14 DIAGNOSIS — E785 Hyperlipidemia, unspecified: Secondary | ICD-10-CM | POA: Diagnosis not present

## 2018-06-14 DIAGNOSIS — R69 Illness, unspecified: Secondary | ICD-10-CM | POA: Diagnosis not present

## 2018-06-14 DIAGNOSIS — F419 Anxiety disorder, unspecified: Secondary | ICD-10-CM

## 2018-06-14 LAB — BASIC METABOLIC PANEL
BUN: 18 mg/dL (ref 6–23)
CALCIUM: 9.6 mg/dL (ref 8.4–10.5)
CO2: 31 meq/L (ref 19–32)
Chloride: 100 mEq/L (ref 96–112)
Creatinine, Ser: 0.9 mg/dL (ref 0.40–1.20)
GFR: 66.52 mL/min (ref >60.00)
Glucose, Bld: 98 mg/dL (ref 70–99)
POTASSIUM: 4.2 meq/L (ref 3.5–5.1)
Sodium: 138 mEq/L (ref 135–145)

## 2018-06-14 LAB — LIPID PANEL
CHOL/HDL RATIO: 4
Cholesterol: 203 mg/dL — ABNORMAL HIGH (ref 0–200)
HDL: 47.4 mg/dL (ref >39.00)
LDL Cholesterol: 118 mg/dL — ABNORMAL HIGH (ref 0–99)
NonHDL: 155.9
TRIGLYCERIDES: 188 mg/dL — AB (ref 0.0–149.0)
VLDL: 37.6 mg/dL (ref 0.0–40.0)

## 2018-06-14 LAB — CBC WITH DIFFERENTIAL/PLATELET
Basophils Absolute: 0.1 10*3/uL (ref 0.0–0.1)
Basophils Relative: 0.7 % (ref 0.0–3.0)
EOS PCT: 2.7 % (ref 0.0–5.0)
Eosinophils Absolute: 0.2 10*3/uL (ref 0.0–0.7)
HCT: 44.5 % (ref 36.0–46.0)
HEMOGLOBIN: 15.1 g/dL — AB (ref 12.0–15.0)
Lymphocytes Relative: 46.8 % — ABNORMAL HIGH (ref 12.0–46.0)
Lymphs Abs: 3.5 10*3/uL (ref 0.7–4.0)
MCHC: 33.9 g/dL (ref 30.0–36.0)
MCV: 90.8 fl (ref 78.0–100.0)
MONOS PCT: 5.9 % (ref 3.0–12.0)
Monocytes Absolute: 0.4 10*3/uL (ref 0.1–1.0)
Neutro Abs: 3.3 10*3/uL (ref 1.4–7.7)
Neutrophils Relative %: 43.9 % (ref 43.0–77.0)
Platelets: 239 10*3/uL (ref 150.0–400.0)
RBC: 4.9 Mil/uL (ref 3.87–5.11)
RDW: 13.1 % (ref 11.5–15.5)
WBC: 7.4 10*3/uL (ref 4.0–10.5)

## 2018-06-14 LAB — HEPATIC FUNCTION PANEL
ALBUMIN: 4.3 g/dL (ref 3.5–5.2)
ALT: 14 U/L (ref 0–35)
AST: 18 U/L (ref 0–37)
Alkaline Phosphatase: 72 U/L (ref 39–117)
Bilirubin, Direct: 0.2 mg/dL (ref 0.0–0.3)
Total Bilirubin: 1.1 mg/dL (ref 0.2–1.2)
Total Protein: 7.3 g/dL (ref 6.0–8.3)

## 2018-06-14 LAB — URINALYSIS
BILIRUBIN URINE: NEGATIVE
HGB URINE DIPSTICK: NEGATIVE
KETONES UR: NEGATIVE
LEUKOCYTES UA: NEGATIVE
Nitrite: NEGATIVE
SPECIFIC GRAVITY, URINE: 1.015 (ref 1.000–1.030)
Total Protein, Urine: NEGATIVE
URINE GLUCOSE: NEGATIVE
UROBILINOGEN UA: 0.2 (ref 0.0–1.0)
pH: 7.5 (ref 5.0–8.0)

## 2018-06-14 LAB — TSH: TSH: 2.81 u[IU]/mL (ref 0.35–4.50)

## 2018-06-28 ENCOUNTER — Encounter: Payer: Self-pay | Admitting: Internal Medicine

## 2018-06-28 ENCOUNTER — Ambulatory Visit (INDEPENDENT_AMBULATORY_CARE_PROVIDER_SITE_OTHER): Payer: Medicare HMO | Admitting: Internal Medicine

## 2018-06-28 DIAGNOSIS — L247 Irritant contact dermatitis due to plants, except food: Secondary | ICD-10-CM

## 2018-06-28 DIAGNOSIS — L259 Unspecified contact dermatitis, unspecified cause: Secondary | ICD-10-CM | POA: Insufficient documentation

## 2018-06-28 DIAGNOSIS — R69 Illness, unspecified: Secondary | ICD-10-CM | POA: Diagnosis not present

## 2018-06-28 DIAGNOSIS — R5383 Other fatigue: Secondary | ICD-10-CM

## 2018-06-28 DIAGNOSIS — F419 Anxiety disorder, unspecified: Secondary | ICD-10-CM | POA: Diagnosis not present

## 2018-06-28 MED ORDER — LORATADINE 10 MG PO TABS: 10.0000 mg | ORAL_TABLET | Freq: Every day | ORAL | 11 refills | Status: DC

## 2018-06-28 MED ORDER — ALPRAZOLAM 0.5 MG PO TABS: 0.5000 mg | ORAL_TABLET | Freq: Three times a day (TID) | ORAL | 3 refills | Status: DC | PRN

## 2018-06-28 MED ORDER — TRIAMCINOLONE ACETONIDE 0.5 % EX CREA: 1.0000 "application " | TOPICAL_CREAM | Freq: Three times a day (TID) | CUTANEOUS | 0 refills | Status: AC

## 2018-06-28 NOTE — Assessment & Plan Note (Signed)
Xanax prn  Potential benefits of a long term benzodiazepines  use as well as potential risks  and complications were explained to the patient and were aknowledged. 

## 2018-06-28 NOTE — Addendum Note (Signed)
Addended by: Tresa Garter on: 06/28/2018 10:29 AM   Modules accepted: Orders

## 2018-06-28 NOTE — Assessment & Plan Note (Addendum)
Poison ivy  Pt refused po steroids Cream triamc qid, Claritin po

## 2018-06-28 NOTE — Assessment & Plan Note (Signed)
On Flecanide

## 2018-06-28 NOTE — Progress Notes (Addendum)
Subjective:  Patient ID: Amber Shelton, female    DOB: 09/12/1951  Age: 67 y.o. MRN: [id number]  CC: No chief complaint on file.   HPI Amber Shelton presents for a 6 mo f/u - anxiety, PAF, dyslipidemia C/o rash - itchy  Outpatient Medications Prior to Visit  Medication Sig Dispense Refill  . ALPRAZolam (XANAX) 0.5 MG tablet Take 1 tablet (0.5 mg total) by mouth 3 (three) times daily as needed for anxiety. 90 tablet 3  . calcium carbonate (OS-CAL) 600 MG TABS Take 600 mg by mouth 2 (two) times daily with a meal. Reported on 02/14/2016    . flecainide (TAMBOCOR) 50 MG tablet Take 0.5 tablets (25 mg total) by mouth 2 (two) times daily. (Patient taking differently: Take 25 mg by mouth 2 (two) times daily. 1/2 tablet in AM, 1 tablet in PM) 60 tablet 11  . furosemide (LASIX) 20 MG tablet Take 0.5-1 tablets (10-20 mg total) by mouth daily as needed for edema. 30 tablet 3  . metoprolol tartrate (LOPRESSOR) 25 MG tablet Take 25 mg by mouth 2 (two) times daily.     . multivitamin (POLY-VITAMINS) CHEW Chew 1 tablet by mouth daily.     . cefdinir (OMNICEF) 125 MG/5ML suspension Take 12 mLs (300 mg total) by mouth 2 (two) times daily. 140 mL 0   No facility-administered medications prior to visit.     ROS: Review of Systems  Constitutional: Negative for activity change, appetite change, chills, fatigue and unexpected weight change.  HENT: Negative for congestion, mouth sores and sinus pressure.   Eyes: Negative for visual disturbance.  Respiratory: Negative for cough and chest tightness.   Gastrointestinal: Negative for abdominal pain and nausea.  Genitourinary: Negative for difficulty urinating, frequency and vaginal pain.  Musculoskeletal: Negative for back pain and gait problem.  Skin: Negative for pallor and rash.  Neurological: Negative for dizziness, tremors, weakness, numbness and headaches.  Psychiatric/Behavioral: Negative for confusion and sleep disturbance. The patient is  nervous/anxious.     Objective:  BP 116/74 (BP Location: Right Arm, Patient Position: Sitting, Cuff Size: Normal)   Pulse (!) 59   Temp 98.4 F (36.9 C) (Oral)   Ht 5' 1.5" (1.562 m)   Wt 135 lb (61.2 kg)   SpO2 98%   BMI 25.09 kg/m   BP Readings from Last 3 Encounters:  06/28/18 116/74  03/14/18 116/68  12/28/17 116/68    Wt Readings from Last 3 Encounters:  06/28/18 135 lb (61.2 kg)  03/14/18 135 lb 1.9 oz (61.3 kg)  12/28/17 136 lb (61.7 kg)    Physical Exam  Constitutional: She appears well-developed. No distress.  HENT:  Head: Normocephalic.  Right Ear: External ear normal.  Left Ear: External ear normal.  Nose: Nose normal.  Mouth/Throat: Oropharynx is clear and moist.  Eyes: Pupils are equal, round, and reactive to light. Conjunctivae are normal. Right eye exhibits no discharge. Left eye exhibits no discharge.  Neck: Normal range of motion. Neck supple. No JVD present. No tracheal deviation present. No thyromegaly present.  Cardiovascular: Normal rate, regular rhythm and normal heart sounds.  Pulmonary/Chest: No stridor. No respiratory distress. She has no wheezes.  Abdominal: Soft. Bowel sounds are normal. She exhibits no distension and no mass. There is no tenderness. There is no rebound and no guarding.  Musculoskeletal: She exhibits no edema or tenderness.  Lymphadenopathy:    She has no cervical adenopathy.  Neurological: She displays normal reflexes. No cranial nerve deficit. She exhibits  normal muscle tone. Coordination normal.  Skin: Rash noted. No erythema.  Psychiatric: She has a normal mood and affect. Her behavior is normal. Judgment and thought content normal.  blistery rash forearms  Lab Results  Component Value Date   WBC 7.4 06/14/2018   HGB 15.1 (H) 06/14/2018   HCT 44.5 06/14/2018   PLT 239.0 06/14/2018   GLUCOSE 98 06/14/2018   CHOL 203 (H) 06/14/2018   TRIG 188.0 (H) 06/14/2018   HDL 47.40 06/14/2018   LDLDIRECT 130.0 12/22/2016    LDLCALC 118 (H) 06/14/2018   ALT 14 06/14/2018   AST 18 06/14/2018   NA 138 06/14/2018   K 4.2 06/14/2018   CL 100 06/14/2018   CREATININE 0.90 06/14/2018   BUN 18 06/14/2018   CO2 31 06/14/2018   TSH 2.81 06/14/2018   HGBA1C 5.4 12/10/2013    Dg Bone Density (dxa)  Result Date: 12/27/2016 Date of study: 12/25/16 Exam: DUAL X-RAY ABSORPTIOMETRY (DXA) FOR BONE MINERAL DENSITY (BMD) Instrument: [employer] Therapist, art Provider: PCP Indication: screening for osteoporosis Comparison: none (please note that it is not possible to compare data from different instruments) Clinical data: Pt is a 67 y.o. female without history of fracture. Results:  Lumbar spine (L1-L4) Femoral neck (FN) 33% distal radius T-score - 3.5 RFN: - 2.4 LFN: - 2.5 n/a Change in BMD from previous DXA test (%) Up 2.0% Up 0.4% n/a (*) statistically significant Assessment: Patient has OSTEOPOROSIS according to the [facility] classification for osteoporosis (see below). Fracture risk: high Comments: the technical quality of the study is good Evaluation for secondary causes should be considered if clinically indicated. Recommend optimizing calcium (1200 mg/day) and vitamin D (800 IU/day). Followup: Repeat BMD is appropriate after 2 years or after 1-2 years if starting treatment. WHO criteria for diagnosis of osteoporosis in postmenopausal women and in men 90 y/o or older: - normal: T-score -1.0 to + 1.0 - osteopenia/low bone density: T-score between -2.5 and -1.0 - osteoporosis: T-score below -2.5 - severe osteoporosis: T-score below -2.5 with history of fragility fracture Note: although not part of the WHO classification, the presence of a fragility fracture, regardless of the T-score, should be considered diagnostic of osteoporosis, provided other causes for the fracture have been excluded. Treatment: The National Osteoporosis Foundation recommends that treatment be considered in postmenopausal women and men age 14 or older with: 1. Hip or  vertebral (clinical or morphometric) fracture 2. T-score of - 2.5 or lower at the spine or hip 3. 10-year fracture probability by FRAX of at least 20% for a major osteoporotic fracture and 3% for a hip fracture Roxy Manns MD    Assessment & Plan:   There are no diagnoses linked to this encounter.   No orders of the defined types were placed in this encounter.    Follow-up: No follow-ups on file.  Sonda Primes, MD

## 2018-09-03 DIAGNOSIS — J101 Influenza due to other identified influenza virus with other respiratory manifestations: Secondary | ICD-10-CM | POA: Diagnosis not present

## 2018-09-05 ENCOUNTER — Other Ambulatory Visit: Payer: Self-pay | Admitting: Internal Medicine

## 2018-09-05 MED ORDER — METOPROLOL TARTRATE 25 MG PO TABS: 25.0000 mg | ORAL_TABLET | Freq: Two times a day (BID) | ORAL | 5 refills | Status: DC

## 2018-09-05 MED ORDER — FLECAINIDE ACETATE 50 MG PO TABS: ORAL_TABLET | ORAL | 5 refills | Status: DC

## 2018-09-09 DIAGNOSIS — J101 Influenza due to other identified influenza virus with other respiratory manifestations: Secondary | ICD-10-CM | POA: Diagnosis not present

## 2018-09-09 DIAGNOSIS — J209 Acute bronchitis, unspecified: Secondary | ICD-10-CM | POA: Diagnosis not present

## 2018-11-08 DIAGNOSIS — L03019 Cellulitis of unspecified finger: Secondary | ICD-10-CM | POA: Diagnosis not present

## 2018-11-08 DIAGNOSIS — M6748 Ganglion, other site: Secondary | ICD-10-CM | POA: Diagnosis not present

## 2018-11-08 DIAGNOSIS — L570 Actinic keratosis: Secondary | ICD-10-CM | POA: Diagnosis not present

## 2018-12-15 DIAGNOSIS — H16141 Punctate keratitis, right eye: Secondary | ICD-10-CM | POA: Diagnosis not present

## 2018-12-15 DIAGNOSIS — H16293 Other keratoconjunctivitis, bilateral: Secondary | ICD-10-CM | POA: Diagnosis not present

## 2018-12-19 DIAGNOSIS — H16141 Punctate keratitis, right eye: Secondary | ICD-10-CM | POA: Diagnosis not present

## 2018-12-19 DIAGNOSIS — H16293 Other keratoconjunctivitis, bilateral: Secondary | ICD-10-CM | POA: Diagnosis not present

## 2018-12-28 ENCOUNTER — Ambulatory Visit (INDEPENDENT_AMBULATORY_CARE_PROVIDER_SITE_OTHER): Payer: Medicare HMO | Admitting: Internal Medicine

## 2018-12-28 ENCOUNTER — Encounter: Payer: Self-pay | Admitting: Internal Medicine

## 2018-12-28 DIAGNOSIS — F419 Anxiety disorder, unspecified: Secondary | ICD-10-CM

## 2018-12-28 DIAGNOSIS — I480 Paroxysmal atrial fibrillation: Secondary | ICD-10-CM

## 2018-12-28 DIAGNOSIS — R69 Illness, unspecified: Secondary | ICD-10-CM | POA: Diagnosis not present

## 2018-12-28 MED ORDER — ALPRAZOLAM 0.5 MG PO TABS: 0.5000 mg | ORAL_TABLET | Freq: Three times a day (TID) | ORAL | 5 refills | Status: DC | PRN

## 2018-12-28 NOTE — Progress Notes (Signed)
Virtual Visit via Telephone Note  I connected with Amber Shelton on 12/28/18 at 10:00 AM EDT by telephone and verified that I am speaking with the correct person using two identifiers.   I discussed the limitations, risks, security and privacy concerns of performing an evaluation and management service by telephone and the availability of in person appointments. I also discussed with the patient that there may be a patient responsible charge related to this service. The patient expressed understanding and agreed to proceed.   History of Present Illness:   F/u anxiety, A fib Observations/Objective:  NAD Assessment and Plan:  See Plan  Follow Up Instructions:    I discussed the assessment and treatment plan with the patient. The patient was provided an opportunity to ask questions and all were answered. The patient agreed with the plan and demonstrated an understanding of the instructions.   The patient was advised to call back or seek an in-person evaluation if the symptoms worsen or if the condition fails to improve as anticipated.  I provided 12 minutes of non-face-to-face time during this encounter.   Sonda Primes, MD

## 2018-12-28 NOTE — Assessment & Plan Note (Signed)
Flecanide, Metoprolol

## 2018-12-28 NOTE — Assessment & Plan Note (Signed)
Xanax prn  Potential benefits of a long term benzodiazepines  use as well as potential risks  and complications were explained to the patient and were aknowledged. 

## 2018-12-29 DIAGNOSIS — L03019 Cellulitis of unspecified finger: Secondary | ICD-10-CM | POA: Diagnosis not present

## 2018-12-29 DIAGNOSIS — L810 Postinflammatory hyperpigmentation: Secondary | ICD-10-CM | POA: Diagnosis not present

## 2019-03-20 DIAGNOSIS — H608X9 Other otitis externa, unspecified ear: Secondary | ICD-10-CM | POA: Diagnosis not present

## 2019-03-20 DIAGNOSIS — H6981 Other specified disorders of Eustachian tube, right ear: Secondary | ICD-10-CM | POA: Diagnosis not present

## 2019-04-05 ENCOUNTER — Other Ambulatory Visit: Payer: Self-pay | Admitting: Internal Medicine

## 2019-05-17 ENCOUNTER — Other Ambulatory Visit: Payer: Self-pay | Admitting: Internal Medicine

## 2019-05-23 DIAGNOSIS — Z6823 Body mass index (BMI) 23.0-23.9, adult: Secondary | ICD-10-CM | POA: Diagnosis not present

## 2019-05-23 DIAGNOSIS — N951 Menopausal and female climacteric states: Secondary | ICD-10-CM | POA: Diagnosis not present

## 2019-05-23 DIAGNOSIS — Z1231 Encounter for screening mammogram for malignant neoplasm of breast: Secondary | ICD-10-CM | POA: Diagnosis not present

## 2019-05-23 DIAGNOSIS — R61 Generalized hyperhidrosis: Secondary | ICD-10-CM | POA: Diagnosis not present

## 2019-05-23 DIAGNOSIS — Z01419 Encounter for gynecological examination (general) (routine) without abnormal findings: Secondary | ICD-10-CM | POA: Diagnosis not present

## 2019-07-05 ENCOUNTER — Ambulatory Visit (INDEPENDENT_AMBULATORY_CARE_PROVIDER_SITE_OTHER): Payer: Medicare HMO | Admitting: Internal Medicine

## 2019-07-05 ENCOUNTER — Other Ambulatory Visit: Payer: Self-pay

## 2019-07-05 ENCOUNTER — Other Ambulatory Visit (INDEPENDENT_AMBULATORY_CARE_PROVIDER_SITE_OTHER): Payer: Medicare HMO

## 2019-07-05 ENCOUNTER — Encounter: Payer: Self-pay | Admitting: Internal Medicine

## 2019-07-05 VITALS — BP 124/72 | HR 56 | Temp 97.8°F | Ht 61.5 in | Wt 132.0 lb

## 2019-07-05 DIAGNOSIS — R944 Abnormal results of kidney function studies: Secondary | ICD-10-CM

## 2019-07-05 DIAGNOSIS — E559 Vitamin D deficiency, unspecified: Secondary | ICD-10-CM

## 2019-07-05 DIAGNOSIS — E785 Hyperlipidemia, unspecified: Secondary | ICD-10-CM | POA: Diagnosis not present

## 2019-07-05 DIAGNOSIS — F419 Anxiety disorder, unspecified: Secondary | ICD-10-CM

## 2019-07-05 DIAGNOSIS — I480 Paroxysmal atrial fibrillation: Secondary | ICD-10-CM

## 2019-07-05 DIAGNOSIS — R69 Illness, unspecified: Secondary | ICD-10-CM | POA: Diagnosis not present

## 2019-07-05 LAB — LIPID PANEL
Cholesterol: 222 mg/dL — ABNORMAL HIGH (ref 0–200)
HDL: 48.7 mg/dL (ref >39.00)
NonHDL: 173.29
Total CHOL/HDL Ratio: 5
Triglycerides: 215 mg/dL — ABNORMAL HIGH (ref 0.0–149.0)
VLDL: 43 mg/dL — ABNORMAL HIGH (ref 0.0–40.0)

## 2019-07-05 LAB — BASIC METABOLIC PANEL
BUN: 19 mg/dL (ref 6–23)
CO2: 28 mEq/L (ref 19–32)
Calcium: 9.7 mg/dL (ref 8.4–10.5)
Chloride: 98 mEq/L (ref 96–112)
Creatinine, Ser: 0.99 mg/dL (ref 0.40–1.20)
GFR: 55.88 mL/min — ABNORMAL LOW (ref >60.00)
Glucose, Bld: 102 mg/dL — ABNORMAL HIGH (ref 70–99)
Potassium: 4.5 mEq/L (ref 3.5–5.1)
Sodium: 136 mEq/L (ref 135–145)

## 2019-07-05 LAB — CBC WITH DIFFERENTIAL/PLATELET
Basophils Absolute: 0.1 10*3/uL (ref 0.0–0.1)
Basophils Relative: 0.5 % (ref 0.0–3.0)
Eosinophils Absolute: 0.7 10*3/uL (ref 0.0–0.7)
Eosinophils Relative: 7.7 % — ABNORMAL HIGH (ref 0.0–5.0)
HCT: 42.2 % (ref 36.0–46.0)
Hemoglobin: 14.5 g/dL (ref 12.0–15.0)
Lymphocytes Relative: 29.4 % (ref 12.0–46.0)
Lymphs Abs: 2.9 10*3/uL (ref 0.7–4.0)
MCHC: 34.4 g/dL (ref 30.0–36.0)
MCV: 92 fl (ref 78.0–100.0)
Monocytes Absolute: 0.7 10*3/uL (ref 0.1–1.0)
Monocytes Relative: 6.7 % (ref 3.0–12.0)
Neutro Abs: 5.4 10*3/uL (ref 1.4–7.7)
Neutrophils Relative %: 55.7 % (ref 43.0–77.0)
Platelets: 241 10*3/uL (ref 150.0–400.0)
RBC: 4.59 Mil/uL (ref 3.87–5.11)
RDW: 13 % (ref 11.5–15.5)
WBC: 9.7 10*3/uL (ref 4.0–10.5)

## 2019-07-05 LAB — URINALYSIS
Bilirubin Urine: NEGATIVE
Hgb urine dipstick: NEGATIVE
Ketones, ur: NEGATIVE
Leukocytes,Ua: NEGATIVE
Nitrite: NEGATIVE
Specific Gravity, Urine: 1.02 (ref 1.000–1.030)
Total Protein, Urine: NEGATIVE
Urine Glucose: NEGATIVE
Urobilinogen, UA: 0.2 (ref 0.0–1.0)
pH: 7 (ref 5.0–8.0)

## 2019-07-05 LAB — HEPATIC FUNCTION PANEL
ALT: 26 U/L (ref 0–35)
AST: 26 U/L (ref 0–37)
Albumin: 4.3 g/dL (ref 3.5–5.2)
Alkaline Phosphatase: 81 U/L (ref 39–117)
Bilirubin, Direct: 0.2 mg/dL (ref 0.0–0.3)
Total Bilirubin: 1.2 mg/dL (ref 0.2–1.2)
Total Protein: 7.2 g/dL (ref 6.0–8.3)

## 2019-07-05 LAB — T4, FREE: Free T4: 0.92 ng/dL (ref 0.60–1.60)

## 2019-07-05 LAB — LDL CHOLESTEROL, DIRECT: Direct LDL: 135 mg/dL

## 2019-07-05 LAB — TSH: TSH: 2.33 u[IU]/mL (ref 0.35–4.50)

## 2019-07-05 LAB — VITAMIN D 25 HYDROXY (VIT D DEFICIENCY, FRACTURES): VITD: 83.57 ng/mL (ref 30.00–100.00)

## 2019-07-05 MED ORDER — ALPRAZOLAM 0.5 MG PO TABS: 0.5000 mg | ORAL_TABLET | Freq: Three times a day (TID) | ORAL | 5 refills | Status: DC | PRN

## 2019-07-05 NOTE — Progress Notes (Signed)
Subjective:  Patient ID: Amber Shelton, female    DOB: 10-02-1950  Age: 68 y.o. MRN: [id number]  CC: No chief complaint on file.   HPI Angelyne W Eckersley presents for anxiety, PAF, edema f/u  Outpatient Medications Prior to Visit  Medication Sig Dispense Refill   ALPRAZolam (XANAX) 0.5 MG tablet Take 1 tablet (0.5 mg total) by mouth 3 (three) times daily as needed for anxiety. 90 tablet 5   calcium carbonate (OS-CAL) 600 MG TABS Take 600 mg by mouth 2 (two) times daily with a meal. Reported on 02/14/2016     flecainide (TAMBOCOR) 50 MG tablet TAKE 1/2 TABLET BY MOUTH IN MORNING AND 1 TABLET IN EVENING 45 tablet 4   furosemide (LASIX) 20 MG tablet Take 0.5-1 tablets (10-20 mg total) by mouth daily as needed for edema. 30 tablet 3   loratadine (CLARITIN) 10 MG tablet Take 1 tablet (10 mg total) by mouth daily. 30 tablet 11   metoprolol tartrate (LOPRESSOR) 25 MG tablet TAKE 1 TABLET BY MOUTH TWO TIMES A DAY 180 tablet 3   multivitamin (POLY-VITAMINS) CHEW Chew 1 tablet by mouth daily.      No facility-administered medications prior to visit.     ROS: Review of Systems  Constitutional: Negative for activity change, appetite change, chills, fatigue and unexpected weight change.  HENT: Negative for congestion, mouth sores and sinus pressure.   Eyes: Negative for visual disturbance.  Respiratory: Negative for cough and chest tightness.   Gastrointestinal: Negative for abdominal pain and nausea.  Genitourinary: Negative for difficulty urinating, frequency and vaginal pain.  Musculoskeletal: Negative for back pain and gait problem.  Skin: Negative for pallor and rash.  Neurological: Negative for dizziness, tremors, weakness, numbness and headaches.  Psychiatric/Behavioral: Negative for confusion, sleep disturbance and suicidal ideas.    Objective:  BP 124/72 (BP Location: Left Arm, Patient Position: Sitting, Cuff Size: Normal)    Pulse (!) 56    Temp 97.8 F (36.6 C) (Oral)    Ht  5' 1.5" (1.562 m)    Wt 132 lb (59.9 kg)    SpO2 98%    BMI 24.54 kg/m   BP Readings from Last 3 Encounters:  07/05/19 124/72  06/28/18 116/74  03/14/18 116/68    Wt Readings from Last 3 Encounters:  07/05/19 132 lb (59.9 kg)  06/28/18 135 lb (61.2 kg)  03/14/18 135 lb 1.9 oz (61.3 kg)    Physical Exam Constitutional:      General: She is not in acute distress.    Appearance: She is well-developed.  HENT:     Head: Normocephalic.     Right Ear: External ear normal.     Left Ear: External ear normal.     Nose: Nose normal.  Eyes:     General:        Right eye: No discharge.        Left eye: No discharge.     Conjunctiva/sclera: Conjunctivae normal.     Pupils: Pupils are equal, round, and reactive to light.  Neck:     Musculoskeletal: Normal range of motion and neck supple.     Thyroid: No thyromegaly.     Vascular: No JVD.     Trachea: No tracheal deviation.  Cardiovascular:     Rate and Rhythm: Normal rate and regular rhythm.     Heart sounds: Normal heart sounds.  Pulmonary:     Effort: No respiratory distress.     Breath sounds: No stridor. No  wheezing.  Abdominal:     General: Bowel sounds are normal. There is no distension.     Palpations: Abdomen is soft. There is no mass.     Tenderness: There is no abdominal tenderness. There is no guarding or rebound.  Musculoskeletal:        General: No tenderness.  Lymphadenopathy:     Cervical: No cervical adenopathy.  Skin:    Findings: No erythema or rash.  Neurological:     Cranial Nerves: No cranial nerve deficit.     Motor: No abnormal muscle tone.     Coordination: Coordination normal.     Deep Tendon Reflexes: Reflexes normal.  Psychiatric:        Behavior: Behavior normal.        Thought Content: Thought content normal.        Judgment: Judgment normal.     Lab Results  Component Value Date   WBC 7.4 06/14/2018   HGB 15.1 (H) 06/14/2018   HCT 44.5 06/14/2018   PLT 239.0 06/14/2018   GLUCOSE 98  06/14/2018   CHOL 203 (H) 06/14/2018   TRIG 188.0 (H) 06/14/2018   HDL 47.40 06/14/2018   LDLDIRECT 130.0 12/22/2016   LDLCALC 118 (H) 06/14/2018   ALT 14 06/14/2018   AST 18 06/14/2018   NA 138 06/14/2018   K 4.2 06/14/2018   CL 100 06/14/2018   CREATININE 0.90 06/14/2018   BUN 18 06/14/2018   CO2 31 06/14/2018   TSH 2.81 06/14/2018   HGBA1C 5.4 12/10/2013    Dg Bone Density (dxa)  Result Date: 12/27/2016 Date of study: 12/25/16 Exam: DUAL X-RAY ABSORPTIOMETRY (DXA) FOR BONE MINERAL DENSITY (BMD) Instrument: [employer] Chiropodist Provider: PCP Indication: screening for osteoporosis Comparison: none (please note that it is not possible to compare data from different instruments) Clinical data: Pt is a 68 y.o. female without history of fracture. Results:  Lumbar spine (L1-L4) Femoral neck (FN) 33% distal radius T-score - 3.5 RFN: - 2.4 LFN: - 2.5 n/a Change in BMD from previous DXA test (%) Up 2.0% Up 0.4% n/a (*) statistically significant Assessment: Patient has OSTEOPOROSIS according to the [facility] classification for osteoporosis (see below). Fracture risk: high Comments: the technical quality of the study is good Evaluation for secondary causes should be considered if clinically indicated. Recommend optimizing calcium (1200 mg/day) and vitamin D (800 IU/day). Followup: Repeat BMD is appropriate after 2 years or after 1-2 years if starting treatment. WHO criteria for diagnosis of osteoporosis in postmenopausal women and in men 54 y/o or older: - normal: T-score -1.0 to + 1.0 - osteopenia/low bone density: T-score between -2.5 and -1.0 - osteoporosis: T-score below -2.5 - severe osteoporosis: T-score below -2.5 with history of fragility fracture Note: although not part of the WHO classification, the presence of a fragility fracture, regardless of the T-score, should be considered diagnostic of osteoporosis, provided other causes for the fracture have been excluded. Treatment: The National  Osteoporosis Foundation recommends that treatment be considered in postmenopausal women and men age 62 or older with: 1. Hip or vertebral (clinical or morphometric) fracture 2. T-score of - 2.5 or lower at the spine or hip 3. 10-year fracture probability by FRAX of at least 20% for a major osteoporotic fracture and 3% for a hip fracture Loura Pardon MD    Assessment & Plan:   There are no diagnoses linked to this encounter.   No orders of the defined types were placed in this encounter.  Follow-up: No follow-ups on file.  Walker Kehr, MD

## 2019-07-05 NOTE — Patient Instructions (Signed)
If you have medicare related insurance (such as traditional Medicare, Blue Cross Medicare, United HealthCare Medicare, or similar), Please make an appointment at the scheduling desk with Jill, the Wellness Health Coach, for your Wellness visit in this office, which is a benefit with your insurance.  

## 2019-07-05 NOTE — Assessment & Plan Note (Signed)
Mild.  Obtain renal ultrasound.  Hydrate yourself well

## 2019-07-05 NOTE — Addendum Note (Signed)
Addended by: Cassandria Anger on: 07/05/2019 10:58 PM   Modules accepted: Orders

## 2019-07-19 ENCOUNTER — Other Ambulatory Visit: Payer: Self-pay | Admitting: Internal Medicine

## 2019-07-24 ENCOUNTER — Encounter: Payer: Self-pay | Admitting: Cardiology

## 2019-07-24 ENCOUNTER — Ambulatory Visit (INDEPENDENT_AMBULATORY_CARE_PROVIDER_SITE_OTHER): Payer: Medicare HMO | Admitting: Cardiology

## 2019-07-24 ENCOUNTER — Other Ambulatory Visit: Payer: Self-pay

## 2019-07-24 VITALS — BP 139/58 | HR 57 | Ht 61.5 in | Wt 132.1 lb

## 2019-07-24 DIAGNOSIS — I480 Paroxysmal atrial fibrillation: Secondary | ICD-10-CM | POA: Diagnosis not present

## 2019-07-24 DIAGNOSIS — E782 Mixed hyperlipidemia: Secondary | ICD-10-CM

## 2019-07-24 MED ORDER — FUROSEMIDE 20 MG PO TABS: 10.0000 mg | ORAL_TABLET | Freq: Every day | ORAL | 3 refills | Status: DC | PRN

## 2019-07-24 MED ORDER — FLECAINIDE ACETATE 50 MG PO TABS: 25.0000 mg | ORAL_TABLET | Freq: Two times a day (BID) | ORAL | 3 refills | Status: DC

## 2019-07-24 MED ORDER — METOPROLOL TARTRATE 25 MG PO TABS: 25.0000 mg | ORAL_TABLET | Freq: Two times a day (BID) | ORAL | 3 refills | Status: DC
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
## 2019-07-26 ENCOUNTER — Ambulatory Visit
Admission: RE | Admit: 2019-07-26 | Discharge: 2019-07-26 | Disposition: A | Discharge status: Home / Self Care | Payer: Medicare HMO | Source: Ambulatory Visit | Attending: Internal Medicine | Admitting: Internal Medicine

## 2019-07-26 DIAGNOSIS — R944 Abnormal results of kidney function studies: Secondary | ICD-10-CM

## 2019-07-26 DIAGNOSIS — N281 Cyst of kidney, acquired: Secondary | ICD-10-CM | POA: Diagnosis not present

## 2019-09-05 DIAGNOSIS — S0500XA Injury of conjunctiva and corneal abrasion without foreign body, unspecified eye, initial encounter: Secondary | ICD-10-CM | POA: Diagnosis not present

## 2020-01-11 ENCOUNTER — Other Ambulatory Visit: Payer: Self-pay

## 2020-01-11 ENCOUNTER — Encounter: Payer: Self-pay | Admitting: Internal Medicine

## 2020-01-11 ENCOUNTER — Ambulatory Visit (INDEPENDENT_AMBULATORY_CARE_PROVIDER_SITE_OTHER): Payer: Medicare HMO | Admitting: Internal Medicine

## 2020-01-11 DIAGNOSIS — F419 Anxiety disorder, unspecified: Secondary | ICD-10-CM | POA: Diagnosis not present

## 2020-01-11 DIAGNOSIS — Z0000 Encounter for general adult medical examination without abnormal findings: Secondary | ICD-10-CM | POA: Diagnosis not present

## 2020-01-11 DIAGNOSIS — E785 Hyperlipidemia, unspecified: Secondary | ICD-10-CM

## 2020-01-11 DIAGNOSIS — R69 Illness, unspecified: Secondary | ICD-10-CM | POA: Diagnosis not present

## 2020-01-11 DIAGNOSIS — J302 Other seasonal allergic rhinitis: Secondary | ICD-10-CM | POA: Diagnosis not present

## 2020-01-11 DIAGNOSIS — I480 Paroxysmal atrial fibrillation: Secondary | ICD-10-CM | POA: Diagnosis not present

## 2020-01-11 DIAGNOSIS — J3089 Other allergic rhinitis: Secondary | ICD-10-CM | POA: Diagnosis not present

## 2020-01-11 MED ORDER — ALPRAZOLAM 0.5 MG PO TABS: 0.5000 mg | ORAL_TABLET | Freq: Three times a day (TID) | ORAL | 5 refills | Status: DC | PRN

## 2020-01-11 NOTE — Assessment & Plan Note (Signed)
Xanax prn  Potential benefits of a long term benzodiazepines  use as well as potential risks  and complications were explained to the patient and were aknowledged. 

## 2020-01-11 NOTE — Assessment & Plan Note (Signed)
Worse Add Flonase, Patanol if needed

## 2020-01-11 NOTE — Progress Notes (Signed)
Subjective:  Patient ID: Amber Shelton, female    DOB: 04/22/1951  Age: 69 y.o. MRN: [id number]  CC: No chief complaint on file.   HPI Amber Shelton presents for anxiety, PAF f/u. Not vaccinated for COVID 19 by choice. C/o allergies  Outpatient Medications Prior to Visit  Medication Sig Dispense Refill  . ALPRAZolam (XANAX) 0.5 MG tablet TAKE ONE TABLET BY MOUTH THREE TIMES A DAY AS NEEDED FOR ANXIETY 90 tablet 3  . calcium carbonate (OS-CAL) 600 MG TABS Take 600 mg by mouth 2 (two) times daily with a meal. Reported on 02/14/2016    . flecainide (TAMBOCOR) 50 MG tablet Take 0.5 tablets (25 mg total) by mouth 2 (two) times daily. TAKE 1/2 TABLET BY MOUTH IN MORNING AND 1 TABLET IN EVENING 90 tablet 3  . furosemide (LASIX) 20 MG tablet Take 0.5-1 tablets (10-20 mg total) by mouth daily as needed for edema. 60 tablet 3  . loratadine (CLARITIN) 10 MG tablet Take 1 tablet (10 mg total) by mouth daily. (Patient taking differently: Take 10 mg by mouth as needed. ) 30 tablet 11  . metoprolol tartrate (LOPRESSOR) 25 MG tablet Take 1 tablet (25 mg total) by mouth 2 (two) times daily. 180 tablet 3  . multivitamin (POLY-VITAMINS) CHEW Chew 1 tablet by mouth daily.      No facility-administered medications prior to visit.    ROS: Review of Systems  Constitutional: Negative for activity change, appetite change, chills, fatigue and unexpected weight change.  HENT: Negative for congestion, mouth sores and sinus pressure.   Eyes: Negative for visual disturbance.  Respiratory: Negative for cough and chest tightness.   Gastrointestinal: Negative for abdominal pain and nausea.  Genitourinary: Negative for difficulty urinating, frequency and vaginal pain.  Musculoskeletal: Negative for back pain and gait problem.  Skin: Negative for pallor and rash.  Neurological: Negative for dizziness, tremors, weakness, numbness and headaches.  Psychiatric/Behavioral: Negative for confusion, sleep disturbance and  suicidal ideas.    Objective:  BP 136/82 (BP Location: Left Arm, Patient Position: Sitting, Cuff Size: Normal)   Pulse (!) 58   Temp 98.5 F (36.9 C) (Oral)   Ht 5' 1.5" (1.562 m)   Wt 137 lb (62.1 kg)   SpO2 97%   BMI 25.47 kg/m   BP Readings from Last 3 Encounters:  01/11/20 136/82  07/24/19 (!) 139/58  07/05/19 124/72    Wt Readings from Last 3 Encounters:  01/11/20 137 lb (62.1 kg)  07/24/19 132 lb 1.6 oz (59.9 kg)  07/05/19 132 lb (59.9 kg)    Physical Exam Constitutional:      General: She is not in acute distress.    Appearance: She is well-developed.  HENT:     Head: Normocephalic.     Right Ear: External ear normal.     Left Ear: External ear normal.     Nose: Nose normal.  Eyes:     General:        Right eye: No discharge.        Left eye: No discharge.     Conjunctiva/sclera: Conjunctivae normal.     Pupils: Pupils are equal, round, and reactive to light.  Neck:     Thyroid: No thyromegaly.     Vascular: No JVD.     Trachea: No tracheal deviation.  Cardiovascular:     Rate and Rhythm: Normal rate and regular rhythm.     Heart sounds: Normal heart sounds.  Pulmonary:  Effort: No respiratory distress.     Breath sounds: No stridor. No wheezing.  Abdominal:     General: Bowel sounds are normal. There is no distension.     Palpations: Abdomen is soft. There is no mass.     Tenderness: There is no abdominal tenderness. There is no guarding or rebound.  Musculoskeletal:        General: No tenderness.     Cervical back: Normal range of motion and neck supple.  Lymphadenopathy:     Cervical: No cervical adenopathy.  Skin:    Findings: No erythema or rash.  Neurological:     Mental Status: She is oriented to person, place, and time.     Cranial Nerves: No cranial nerve deficit.     Motor: No abnormal muscle tone.     Coordination: Coordination normal.     Deep Tendon Reflexes: Reflexes normal.  Psychiatric:        Behavior: Behavior normal.         Thought Content: Thought content normal.        Judgment: Judgment normal.     Lab Results  Component Value Date   WBC 9.7 07/05/2019   HGB 14.5 07/05/2019   HCT 42.2 07/05/2019   PLT 241.0 07/05/2019   GLUCOSE 102 (H) 07/05/2019   CHOL 222 (H) 07/05/2019   TRIG 215.0 (H) 07/05/2019   HDL 48.70 07/05/2019   LDLDIRECT 135.0 07/05/2019   LDLCALC 118 (H) 06/14/2018   ALT 26 07/05/2019   AST 26 07/05/2019   NA 136 07/05/2019   K 4.5 07/05/2019   CL 98 07/05/2019   CREATININE 0.99 07/05/2019   BUN 19 07/05/2019   CO2 28 07/05/2019   TSH 2.33 07/05/2019   HGBA1C 5.4 12/10/2013    US Renal  Result Date: 07/26/2019 CLINICAL DATA:  Initial evaluation for decreased GFR. EXAM: RENAL / URINARY TRACT ULTRASOUND COMPLETE COMPARISON:  Prior ultrasound from 06/12/2014. FINDINGS: Right Kidney: Renal measurements: 9.9 x 3.2 x 3.4 cm = volume: 57.0 mL. Increased echogenicity within the renal parenchyma, suggesting medical renal disease. No nephrolithiasis or hydronephrosis. 1.0 x 0.9 x 1.1 cm minimally complex cyst present within the interpolar region, similar relative to 2015, most likely benign. Left Kidney: Renal measurements: 11.1 x 3.8 x 4.4 cm = volume: 96.6 mL. Increased echogenicity within the renal parenchyma, compatible with medical renal disease. No nephrolithiasis or hydronephrosis. No focal renal mass. Bladder: Appears normal for degree of bladder distention. Bilateral ureteral jets are visualized. Other: None. IMPRESSION: 1. Increased echogenicity within the renal parenchyma, compatible with medical renal disease. 2. No hydronephrosis. 3. 1.1 cm cyst within the interpolar right kidney, grossly stable from 2015, and most likely benign. Electronically Signed   By: Rise Mu M.D.   On: 07/26/2019 21:52    Assessment & Plan:    Sonda Primes, MD

## 2020-01-11 NOTE — Patient Instructions (Signed)
Sign up for  Digital library ( via Libby app on your phone or your ipad). If you don't have a library card  - go to any library branch. They will set you up in 15 minutes. It is free. You can check out books to read and to listen, check out magazines and newspapers, movies etc.   

## 2020-01-11 NOTE — Assessment & Plan Note (Signed)
CHA2DS2VASC score 1 On Flecanide, Metoprolol

## 2020-01-29 ENCOUNTER — Ambulatory Visit: Payer: Medicare HMO | Admitting: Cardiology

## 2020-02-14 NOTE — Progress Notes (Signed)
Patient referred by Plotnikov, Evie Lacks, MD for paroxysmal atrial fibrillation  Subjective:   Amber Shelton, female    DOB: Jan 05, 1951, 69 y.o.   MRN: [id number]   Chief Complaint  Patient presents with  . Atrial Fibrillation  . Hyperlipidemia  . Follow-up    6 month     HPI  69 year old Caucasian female with paroxysmal atrial fibrillation.   Patient has reduce her flecainide use to 25 mg bid.  She has not had any recurrent palpitation symptoms.  She has a smart watch that gives her EKG reading.  She has not seen atrial fibrillation appearance March in 5 years.  Initial consultation HPI 07/2019: patient was previously followed by cardiologist Dr. Wynonia Lawman.  She has been on flecainide for management of paroxysmal atrial fibrillation. Patient has maintained sinus rhythm for 4 years on flecainide.  She reports that she has felt difficulty urinating ever since starting flecainide, which partially improved after reducing morning dose to half tablet a day.  She questions if she can potentially come off flecainide.  Patient has no acutely reversible risk factors for atrial fibrillation at this time.  She has moderate sleep apnea, but was recommended that she does not need CPAP for the same.    Current Outpatient Medications on File Prior to Visit  Medication Sig Dispense Refill  . ALPRAZolam (XANAX) 0.5 MG tablet Take 1 tablet (0.5 mg total) by mouth 3 (three) times daily as needed. for anxiety 90 tablet 5  . calcium carbonate (OS-CAL) 600 MG TABS Take 600 mg by mouth 2 (two) times daily with a meal. Reported on 02/14/2016    . flecainide (TAMBOCOR) 50 MG tablet Take 0.5 tablets (25 mg total) by mouth 2 (two) times daily. TAKE 1/2 TABLET BY MOUTH IN MORNING AND 1 TABLET IN EVENING 90 tablet 3  . furosemide (LASIX) 20 MG tablet Take 0.5-1 tablets (10-20 mg total) by mouth daily as needed for edema. 60 tablet 3  . loratadine (CLARITIN) 10 MG tablet Take 1 tablet (10 mg total) by mouth  daily. (Patient taking differently: Take 10 mg by mouth as needed. ) 30 tablet 11  . metoprolol tartrate (LOPRESSOR) 25 MG tablet Take 1 tablet (25 mg total) by mouth 2 (two) times daily. 180 tablet 3  . multivitamin (POLY-VITAMINS) CHEW Chew 1 tablet by mouth daily.      No current facility-administered medications on file prior to visit.    Cardiovascular studies:  EKG 02/15/2020: Sinus rhythm 54 bpm Normal EKG  Lexiscan nuclear stress test 2015: Normal stress nuclear study. and Low risk stress nuclear study with mild breast attenuation artifact..  Echocardiogram 2015: - Left ventricle: The cavity size was normal. Systolic  function was normal. The estimated ejection fraction was  55%. Wall motion was normal; there were no regional wall  motion abnormalities.  - Atrial septum: No defect or patent foramen ovale was  identified.  - Pericardium, extracardiac: A trivial pericardial effusion  was identified posterior to the heart.    Recent labs: 07/05/2019: Glucose 102, BUN/Cr 19/0.99. EGFR 55. Na/K 136/4.5. Rest of the CMP normal H/H 14/42. MCV 92. Platelets 241 Chol 222, TG 215, HDL 48, LDL 135 TSH 2.3 normal  2015: HbA1C 5.4%    Review of Systems  Cardiovascular: Negative for chest pain, dyspnea on exertion, leg swelling, palpitations and syncope.         Vitals:   02/15/20 1118  BP: 139/71  Pulse: (!) 56  SpO2: 100%  Body mass index is 25.89 kg/m. Filed Weights   02/15/20 1118  Weight: 137 lb (62.1 kg)     Objective:   Physical Exam  Constitutional: No distress.  Neck: No JVD present.  Cardiovascular: Normal rate, regular rhythm, normal heart sounds and intact distal pulses.  No murmur heard. Pulmonary/Chest: Effort normal and breath sounds normal. She has no wheezes. She has no rales.  Musculoskeletal:        General: No edema.  Nursing note and vitals reviewed.         Assessment & Recommendations:   69 year old Caucasian  female with paroxysmal atrial fibrillation.   Paroxysmal atrial fibrillation: Given that she is on very low-dose of flecainide at this time, 25 mg twice daily, I suspect that she may do well even without flecainide.  I have asked her to reduce flecainide to 25 mg once daily, and then stop after 7 days. Place event monitor to monitor for Afib.  CHA2DS2VASc score 2, annual stroke risk 2.2%, thus not on anticoagulation.  Primary prtevention: 10 yr ASCVD risk 8.8%. Patient would like avoid being on statin at this time. Discussed diet and lifestyle modifications, including regular physical activity and pesco-meditarraneian diet.   F/u in 6 weeks to look for recurrence of Afib off flecainide.  Nigel Mormon, MD [facility] Pager: [phone] Office: [phone]

## 2020-02-15 ENCOUNTER — Other Ambulatory Visit: Payer: Self-pay

## 2020-02-15 ENCOUNTER — Ambulatory Visit: Payer: Medicare HMO

## 2020-02-15 ENCOUNTER — Ambulatory Visit: Payer: Medicare HMO | Admitting: Cardiology

## 2020-02-15 ENCOUNTER — Encounter: Payer: Self-pay | Admitting: Cardiology

## 2020-02-15 VITALS — BP 139/71 | HR 56 | Ht 61.0 in | Wt 137.0 lb

## 2020-02-15 DIAGNOSIS — I480 Paroxysmal atrial fibrillation: Secondary | ICD-10-CM

## 2020-04-02 NOTE — Progress Notes (Signed)
Patient referred by Plotnikov, Evie Lacks, MD for paroxysmal atrial fibrillation  Subjective:   Amber Shelton, female    DOB: 1951-07-13, 69 y.o.   MRN: [id number]   Chief Complaint  Patient presents with  . Atrial Fibrillation     HPI  69 year old Caucasian female with paroxysmal atrial fibrillation.   She has not seen atrial fibrillation appearance March in since 2016 or so. At last visit, I reduced her to reduce flecainide to 25 mg once daily, and then stop after 7 days. Place event monitor to monitor for Afib.  Patient is here for follow up visit. She has not had any recurrence of palpitations symptoms off flecainide.   Initial consultation HPI 07/2019: patient was previously followed by cardiologist Dr. Wynonia Lawman.  She has been on flecainide for management of paroxysmal atrial fibrillation. Patient has maintained sinus rhythm for 4 years on flecainide.  She reports that she has felt difficulty urinating ever since starting flecainide, which partially improved after reducing morning dose to half tablet a day.  She questions if she can potentially come off flecainide.  Patient has no acutely reversible risk factors for atrial fibrillation at this time.  She has moderate sleep apnea, but was recommended that she does not need CPAP for the same.    Current Outpatient Medications on File Prior to Visit  Medication Sig Dispense Refill  . ALPRAZolam (XANAX) 0.5 MG tablet Take 1 tablet (0.5 mg total) by mouth 3 (three) times daily as needed. for anxiety 90 tablet 5  . calcium carbonate (OS-CAL) 600 MG TABS Take 600 mg by mouth 2 (two) times daily with a meal. Reported on 02/14/2016    . flecainide (TAMBOCOR) 50 MG tablet Take 0.5 tablets (25 mg total) by mouth 2 (two) times daily. TAKE 1/2 TABLET BY MOUTH IN MORNING AND 1 TABLET IN EVENING 90 tablet 3  . furosemide (LASIX) 20 MG tablet Take 0.5-1 tablets (10-20 mg total) by mouth daily as needed for edema. 60 tablet 3  . loratadine  (CLARITIN) 10 MG tablet Take 1 tablet (10 mg total) by mouth daily. (Patient taking differently: Take 10 mg by mouth as needed. ) 30 tablet 11  . metoprolol tartrate (LOPRESSOR) 25 MG tablet Take 1 tablet (25 mg total) by mouth 2 (two) times daily. 180 tablet 3  . multivitamin (POLY-VITAMINS) CHEW Chew 1 tablet by mouth daily.      No current facility-administered medications on file prior to visit.    Cardiovascular studies:  EKG 02/15/2020: Sinus rhythm 54 bpm Normal EKG  Lexiscan nuclear stress test 2015: Normal stress nuclear study. and Low risk stress nuclear study with mild breast attenuation artifact..  Echocardiogram 2015: - Left ventricle: The cavity size was normal. Systolic  function was normal. The estimated ejection fraction was  55%. Wall motion was normal; there were no regional wall  motion abnormalities.  - Atrial septum: No defect or patent foramen ovale was  identified.  - Pericardium, extracardiac: A trivial pericardial effusion  was identified posterior to the heart.    Recent labs: 07/05/2019: Glucose 102, BUN/Cr 19/0.99. EGFR 55. Na/K 136/4.5. Rest of the CMP normal H/H 14/42. MCV 92. Platelets 241 Chol 222, TG 215, HDL 48, LDL 135 TSH 2.3 normal  2015: HbA1C 5.4%    Review of Systems  Cardiovascular: Negative for chest pain, dyspnea on exertion, leg swelling, palpitations and syncope.         There were no vitals filed for this visit.  There is no height or weight on file to calculate BMI. There were no vitals filed for this visit.   Objective:   Physical Exam Vitals and nursing note reviewed.  Constitutional:      General: She is not in acute distress. Neck:     Vascular: No JVD.  Cardiovascular:     Rate and Rhythm: Normal rate and regular rhythm.     Pulses: Intact distal pulses.     Heart sounds: Normal heart sounds. No murmur heard.   Pulmonary:     Effort: Pulmonary effort is normal.     Breath sounds: Normal  breath sounds. No wheezing or rales.           Assessment & Recommendations:   69 year old Caucasian female with paroxysmal atrial fibrillation.   Paroxysmal atrial fibrillation: Staying in sinus rhythm off Flecainide. Continue metoprolol tartarate 25 mg bid CHA2DS2VASc score 2, annual stroke risk 2.2%, thus not on anticoagulation.  Primary prtevention: 10 yr ASCVD risk 8.8%. Patient would like avoid being on statin at this time. Discussed diet and lifestyle modifications, including regular physical activity and pesco-meditarraneian diet.   F/u in 1 year  Nigel Mormon, MD [facility] Pager: [phone] Office: [phone]

## 2020-04-03 ENCOUNTER — Other Ambulatory Visit: Payer: Self-pay

## 2020-04-03 ENCOUNTER — Encounter: Payer: Self-pay | Admitting: Cardiology

## 2020-04-03 ENCOUNTER — Ambulatory Visit: Payer: Medicare HMO | Admitting: Cardiology

## 2020-04-03 VITALS — BP 133/69 | HR 53 | Wt 134.6 lb

## 2020-04-03 DIAGNOSIS — E782 Mixed hyperlipidemia: Secondary | ICD-10-CM | POA: Diagnosis not present

## 2020-04-03 DIAGNOSIS — I480 Paroxysmal atrial fibrillation: Secondary | ICD-10-CM

## 2020-04-03 IMAGING — US US RENAL
1 series · 14 of 25 positions shown · non-contrast
Comparison: Prior ultrasound from 06/12/2014.

CLINICAL DATA: Initial evaluation for decreased GFR.

EXAM:
RENAL / URINARY TRACT ULTRASOUND COMPLETE

[Series 1: us renal · 0.22mm/px · 14 of 52 slices shown]
[im 1/52]
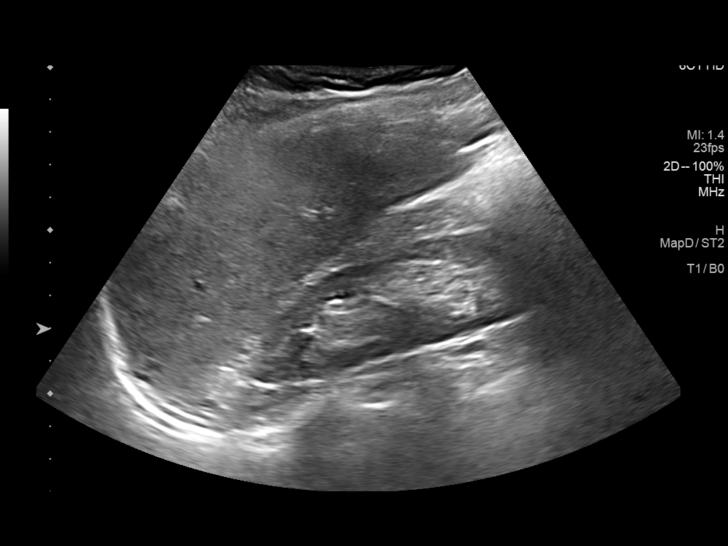
[im 5/52]
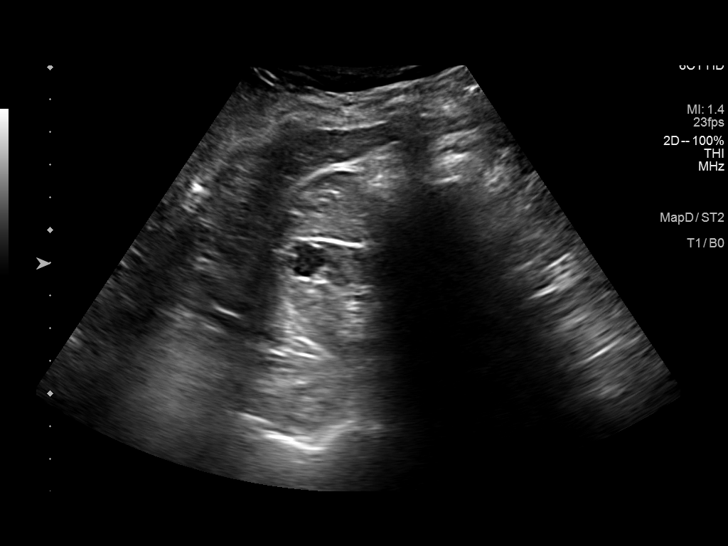
[im 9/52]
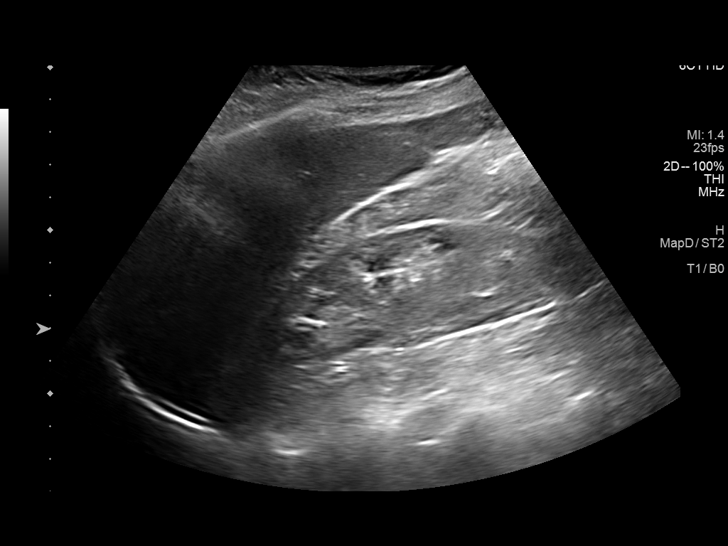
[im 13/52]
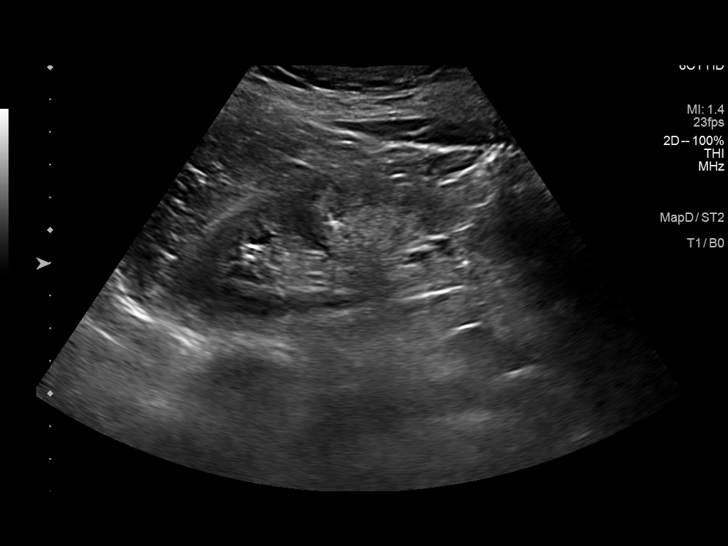
[im 18/52]
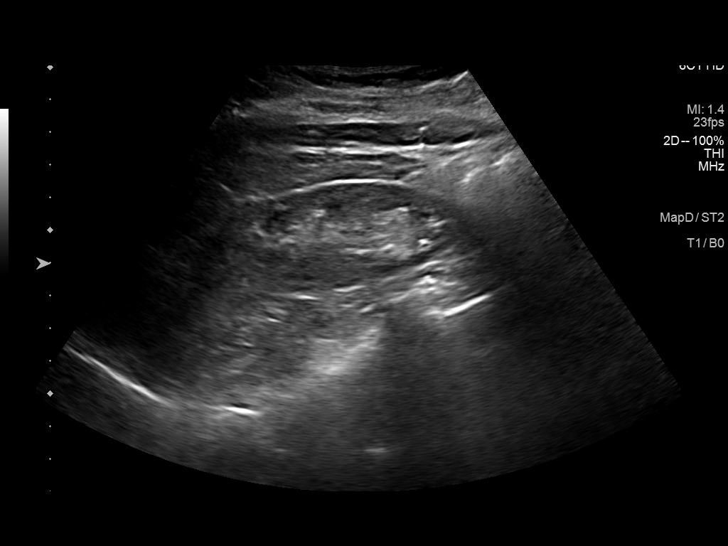
[im 20/52]
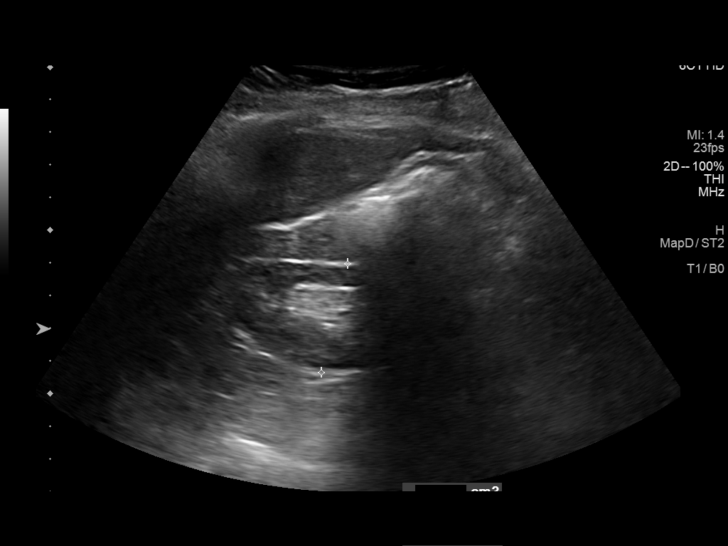
[im 24/52]
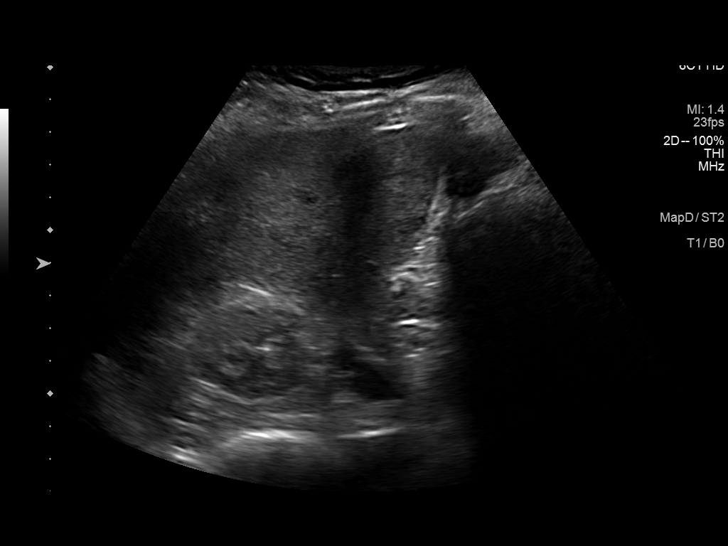
[im 28/52]
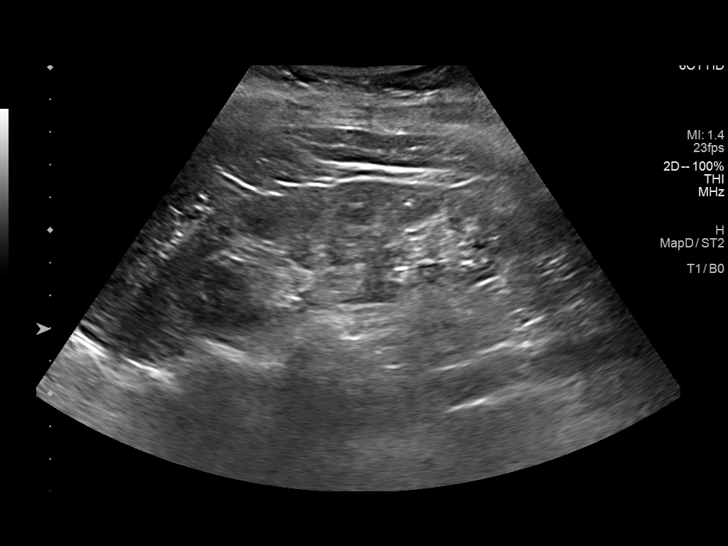
[im 32/52]
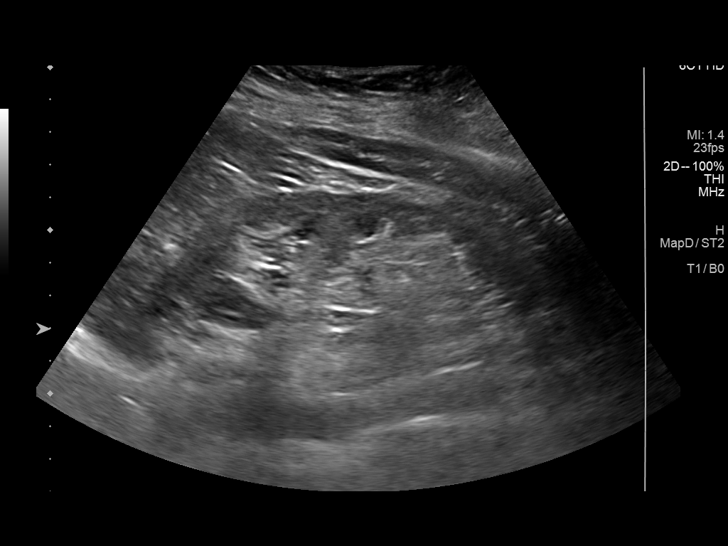
[im 35/52]
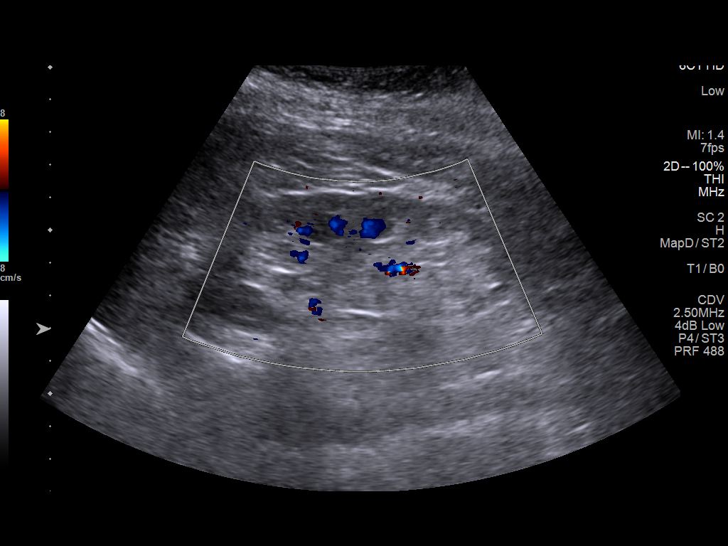
[im 39/52]
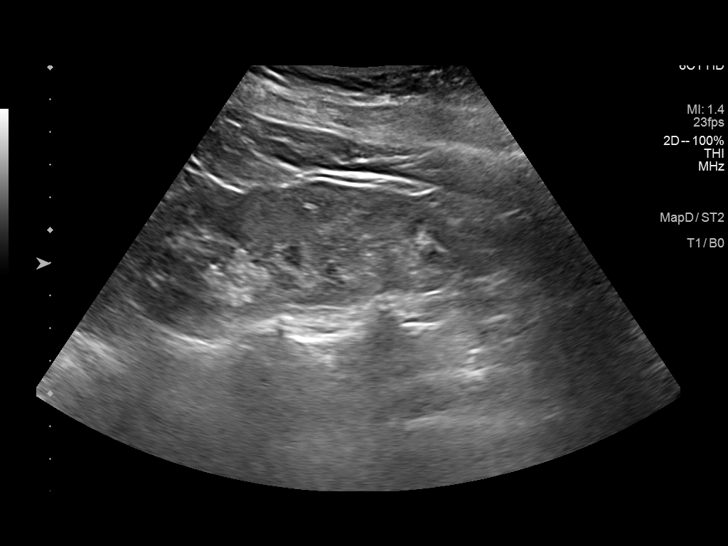
[im 43/52]
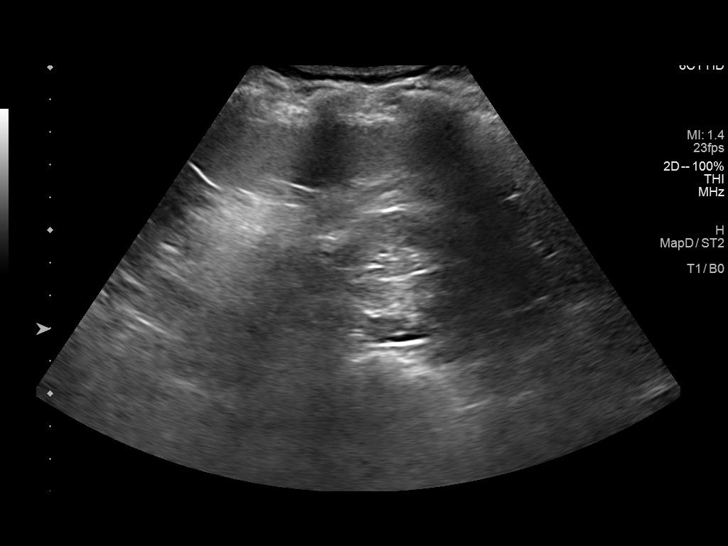
[im 47/52]
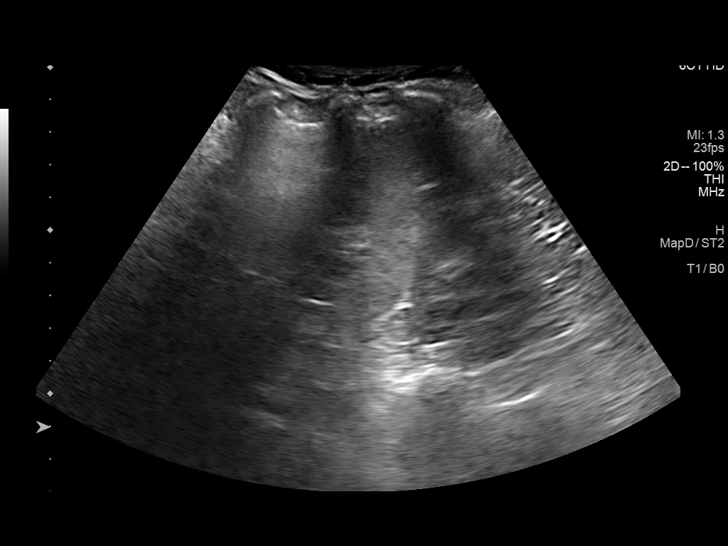
[im 52/52]
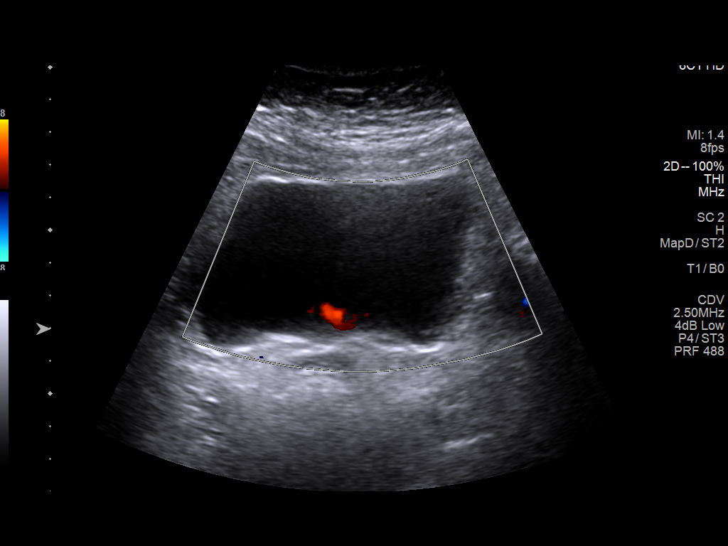

[14 of 25 positions shown; findings below may reference images not displayed]

FINDINGS: Right Kidney:

Renal measurements: 9.9 x 3.2 x 3.4 cm = volume: 57.0 mL. Increased
echogenicity within the renal parenchyma, suggesting medical renal
disease. No nephrolithiasis or hydronephrosis. 1.0 x 0.9 x 1.1 cm
minimally complex cyst present within the interpolar region, similar
relative to 7080, most likely benign.

Left Kidney:

Renal measurements: 11.1 x 3.8 x 4.4 cm = volume: 96.6 mL. Increased
echogenicity within the renal parenchyma, compatible with medical
renal disease. No nephrolithiasis or hydronephrosis. No focal renal
mass.

Bladder:

Appears normal for degree of bladder distention. Bilateral ureteral
jets are visualized.

Other:

None.
IMPRESSION: 1. Increased echogenicity within the renal parenchyma, compatible
with medical renal disease.
2. No hydronephrosis.
3. 1.1 cm cyst within the interpolar right kidney, grossly stable
from 7080, and most likely benign.

## 2020-07-15 ENCOUNTER — Ambulatory Visit (INDEPENDENT_AMBULATORY_CARE_PROVIDER_SITE_OTHER): Payer: Medicare HMO | Admitting: Internal Medicine

## 2020-07-15 ENCOUNTER — Other Ambulatory Visit: Payer: Self-pay

## 2020-07-15 ENCOUNTER — Encounter: Payer: Self-pay | Admitting: Internal Medicine

## 2020-07-15 DIAGNOSIS — F419 Anxiety disorder, unspecified: Secondary | ICD-10-CM | POA: Diagnosis not present

## 2020-07-15 DIAGNOSIS — Z0000 Encounter for general adult medical examination without abnormal findings: Secondary | ICD-10-CM

## 2020-07-15 DIAGNOSIS — I480 Paroxysmal atrial fibrillation: Secondary | ICD-10-CM

## 2020-07-15 DIAGNOSIS — E785 Hyperlipidemia, unspecified: Secondary | ICD-10-CM

## 2020-07-15 DIAGNOSIS — R69 Illness, unspecified: Secondary | ICD-10-CM | POA: Diagnosis not present

## 2020-07-15 LAB — CBC WITH DIFFERENTIAL/PLATELET
Basophils Absolute: 0.1 10*3/uL (ref 0.0–0.1)
Basophils Relative: 1.1 % (ref 0.0–3.0)
Eosinophils Absolute: 0.2 10*3/uL (ref 0.0–0.7)
Eosinophils Relative: 2.1 % (ref 0.0–5.0)
HCT: 43.4 % (ref 36.0–46.0)
Hemoglobin: 14.7 g/dL (ref 12.0–15.0)
Lymphocytes Relative: 34.5 % (ref 12.0–46.0)
Lymphs Abs: 2.8 10*3/uL (ref 0.7–4.0)
MCHC: 33.9 g/dL (ref 30.0–36.0)
MCV: 92.1 fl (ref 78.0–100.0)
Monocytes Absolute: 0.5 10*3/uL (ref 0.1–1.0)
Monocytes Relative: 6.1 % (ref 3.0–12.0)
Neutro Abs: 4.6 10*3/uL (ref 1.4–7.7)
Neutrophils Relative %: 56.2 % (ref 43.0–77.0)
Platelets: 246 10*3/uL (ref 150.0–400.0)
RBC: 4.71 Mil/uL (ref 3.87–5.11)
RDW: 13.1 % (ref 11.5–15.5)
WBC: 8.2 10*3/uL (ref 4.0–10.5)

## 2020-07-15 LAB — COMPREHENSIVE METABOLIC PANEL
ALT: 16 U/L (ref 0–35)
AST: 22 U/L (ref 0–37)
Albumin: 4.2 g/dL (ref 3.5–5.2)
Alkaline Phosphatase: 71 U/L (ref 39–117)
BUN: 14 mg/dL (ref 6–23)
CO2: 31 mEq/L (ref 19–32)
Calcium: 9.7 mg/dL (ref 8.4–10.5)
Chloride: 100 mEq/L (ref 96–112)
Creatinine, Ser: 0.89 mg/dL (ref 0.40–1.20)
GFR: 66.62 mL/min (ref >60.00)
Glucose, Bld: 97 mg/dL (ref 70–99)
Potassium: 4.8 mEq/L (ref 3.5–5.1)
Sodium: 137 mEq/L (ref 135–145)
Total Bilirubin: 1.2 mg/dL (ref 0.2–1.2)
Total Protein: 6.8 g/dL (ref 6.0–8.3)

## 2020-07-15 LAB — URINALYSIS, ROUTINE W REFLEX MICROSCOPIC
Bilirubin Urine: NEGATIVE
Hgb urine dipstick: NEGATIVE
Ketones, ur: NEGATIVE
Nitrite: NEGATIVE
Specific Gravity, Urine: 1.02 (ref 1.000–1.030)
Total Protein, Urine: NEGATIVE
Urine Glucose: NEGATIVE
Urobilinogen, UA: 0.2 (ref 0.0–1.0)
pH: 6 (ref 5.0–8.0)

## 2020-07-15 LAB — TSH: TSH: 3.03 u[IU]/mL (ref 0.35–4.50)

## 2020-07-15 LAB — LIPID PANEL
Cholesterol: 233 mg/dL — ABNORMAL HIGH (ref 0–200)
HDL: 50.1 mg/dL (ref >39.00)
NonHDL: 182.72
Total CHOL/HDL Ratio: 5
Triglycerides: 214 mg/dL — ABNORMAL HIGH (ref 0.0–149.0)
VLDL: 42.8 mg/dL — ABNORMAL HIGH (ref 0.0–40.0)

## 2020-07-15 LAB — LDL CHOLESTEROL, DIRECT: Direct LDL: 149 mg/dL

## 2020-07-15 MED ORDER — ALPRAZOLAM 0.5 MG PO TABS
0.5000 mg | ORAL_TABLET | Freq: Three times a day (TID) | ORAL | 5 refills | Status: DC | PRN
Start: 2020-07-15 — End: 2021-01-27

## 2020-07-15 NOTE — Assessment & Plan Note (Signed)
Dr Jacinto Halim

## 2020-07-15 NOTE — Progress Notes (Signed)
Subjective:  Patient ID: Amber Shelton, female    DOB: 1950/12/12  Age: 69 y.o. MRN: [id number]  CC: Annual Exam   HPI Amber Shelton presents for A fib Pt declined COVID 19 vaccine Well exam  Outpatient Medications Prior to Visit  Medication Sig Dispense Refill  . ALPRAZolam (XANAX) 0.5 MG tablet Take 1 tablet (0.5 mg total) by mouth 3 (three) times daily as needed. for anxiety 90 tablet 5  . calcium carbonate (OS-CAL) 600 MG TABS Take 600 mg by mouth 2 (two) times daily with a meal. Reported on 02/14/2016    . furosemide (LASIX) 20 MG tablet Take 0.5-1 tablets (10-20 mg total) by mouth daily as needed for edema. 60 tablet 3  . loratadine (CLARITIN) 10 MG tablet Take 1 tablet (10 mg total) by mouth daily. (Patient taking differently: Take 10 mg by mouth as needed. ) 30 tablet 11  . metoprolol tartrate (LOPRESSOR) 25 MG tablet Take 1 tablet (25 mg total) by mouth 2 (two) times daily. 180 tablet 3  . multivitamin (POLY-VITAMINS) CHEW Chew 1 tablet by mouth daily.     . flecainide (TAMBOCOR) 50 MG tablet Take 0.5 tablets (25 mg total) by mouth 2 (two) times daily. TAKE 1/2 TABLET BY MOUTH IN MORNING AND 1 TABLET IN EVENING (Patient not taking: Reported on 07/15/2020) 90 tablet 3   No facility-administered medications prior to visit.    ROS: Review of Systems  Constitutional: Negative for activity change, appetite change, chills, fatigue and unexpected weight change.  HENT: Negative for congestion, mouth sores and sinus pressure.   Eyes: Negative for visual disturbance.  Respiratory: Negative for cough and chest tightness.   Gastrointestinal: Negative for abdominal pain and nausea.  Genitourinary: Negative for difficulty urinating, frequency and vaginal pain.  Musculoskeletal: Negative for back pain and gait problem.  Skin: Negative for pallor and rash.  Neurological: Negative for dizziness, tremors, weakness, numbness and headaches.  Psychiatric/Behavioral: Negative for confusion  and sleep disturbance.    Objective:  BP 118/70 (BP Location: Left Arm, Patient Position: Sitting, Cuff Size: Large)   Pulse (!) 55   Temp 98.3 F (36.8 C) (Oral)   Ht 5\' 1"  (1.549 m)   Wt 132 lb 6.4 oz (60.1 kg)   SpO2 99%   BMI 25.02 kg/m   BP Readings from Last 3 Encounters:  07/15/20 118/70  04/03/20 133/69  02/15/20 139/71    Wt Readings from Last 3 Encounters:  07/15/20 132 lb 6.4 oz (60.1 kg)  04/03/20 134 lb 9.6 oz (61.1 kg)  02/15/20 137 lb (62.1 kg)    Physical Exam Constitutional:      General: She is not in acute distress.    Appearance: She is well-developed.  HENT:     Head: Normocephalic.     Right Ear: External ear normal.     Left Ear: External ear normal.     Nose: Nose normal.  Eyes:     General:        Right eye: No discharge.        Left eye: No discharge.     Conjunctiva/sclera: Conjunctivae normal.     Pupils: Pupils are equal, round, and reactive to light.  Neck:     Thyroid: No thyromegaly.     Vascular: No JVD.     Trachea: No tracheal deviation.  Cardiovascular:     Rate and Rhythm: Normal rate and regular rhythm.     Heart sounds: Normal heart sounds.  Pulmonary:  Effort: No respiratory distress.     Breath sounds: No stridor. No wheezing.  Abdominal:     General: Bowel sounds are normal. There is no distension.     Palpations: Abdomen is soft. There is no mass.     Tenderness: There is no abdominal tenderness. There is no guarding or rebound.  Musculoskeletal:        General: No tenderness.     Cervical back: Normal range of motion and neck supple.  Lymphadenopathy:     Cervical: No cervical adenopathy.  Skin:    Findings: No erythema or rash.  Neurological:     Cranial Nerves: No cranial nerve deficit.     Motor: No abnormal muscle tone.     Coordination: Coordination normal.     Deep Tendon Reflexes: Reflexes normal.  Psychiatric:        Behavior: Behavior normal.        Thought Content: Thought content normal.          Judgment: Judgment normal.     Lab Results  Component Value Date   WBC 9.7 07/05/2019   HGB 14.5 07/05/2019   HCT 42.2 07/05/2019   PLT 241.0 07/05/2019   GLUCOSE 102 (H) 07/05/2019   CHOL 222 (H) 07/05/2019   TRIG 215.0 (H) 07/05/2019   HDL 48.70 07/05/2019   LDLDIRECT 135.0 07/05/2019   LDLCALC 118 (H) 06/14/2018   ALT 26 07/05/2019   AST 26 07/05/2019   NA 136 07/05/2019   K 4.5 07/05/2019   CL 98 07/05/2019   CREATININE 0.99 07/05/2019   BUN 19 07/05/2019   CO2 28 07/05/2019   TSH 2.33 07/05/2019   HGBA1C 5.4 12/10/2013    US Renal  Result Date: 07/26/2019 CLINICAL DATA:  Initial evaluation for decreased GFR. EXAM: RENAL / URINARY TRACT ULTRASOUND COMPLETE COMPARISON:  Prior ultrasound from 06/12/2014. FINDINGS: Right Kidney: Renal measurements: 9.9 x 3.2 x 3.4 cm = volume: 57.0 mL. Increased echogenicity within the renal parenchyma, suggesting medical renal disease. No nephrolithiasis or hydronephrosis. 1.0 x 0.9 x 1.1 cm minimally complex cyst present within the interpolar region, similar relative to 2015, most likely benign. Left Kidney: Renal measurements: 11.1 x 3.8 x 4.4 cm = volume: 96.6 mL. Increased echogenicity within the renal parenchyma, compatible with medical renal disease. No nephrolithiasis or hydronephrosis. No focal renal mass. Bladder: Appears normal for degree of bladder distention. Bilateral ureteral jets are visualized. Other: None. IMPRESSION: 1. Increased echogenicity within the renal parenchyma, compatible with medical renal disease. 2. No hydronephrosis. 3. 1.1 cm cyst within the interpolar right kidney, grossly stable from 2015, and most likely benign. Electronically Signed   By: Rise Mu M.D.   On: 07/26/2019 21:52    Assessment & Plan:    Sonda Primes, MD

## 2020-07-15 NOTE — Assessment & Plan Note (Signed)
Xanax prn  Potential benefits of a long term benzodiazepines  use as well as potential risks  and complications were explained to the patient and were aknowledged. 

## 2020-07-15 NOTE — Assessment & Plan Note (Signed)
  We discussed age appropriate health related issues, including available/recomended screening tests and vaccinations. Labs were ordered to be later reviewed . All questions were answered. We discussed one or more of the following - seat belt use, use of sunscreen/sun exposure exercise, safe sex, fall risk reduction, second hand smoke exposure, firearm use and storage, seat belt use, a need for adhering to healthy diet and exercise. Labs were ordered.  All questions were answered. Pt declined COVID 19 vaccine

## 2020-07-15 NOTE — Assessment & Plan Note (Signed)
Off Flecanide, on Metoprolol

## 2020-07-15 NOTE — Addendum Note (Signed)
Addended by: Vincenza Hews on: 07/15/2020 09:42 AM   Modules accepted: Orders

## 2020-08-07 ENCOUNTER — Other Ambulatory Visit: Payer: Self-pay | Admitting: Cardiology

## 2020-08-07 DIAGNOSIS — I480 Paroxysmal atrial fibrillation: Secondary | ICD-10-CM

## 2020-10-03 ENCOUNTER — Telehealth: Payer: Self-pay | Admitting: Internal Medicine

## 2020-10-03 NOTE — Telephone Encounter (Signed)
LVM for pt to rtn my call to schedule AWV with [facility]. Please schedule appt if pt calls the office.  

## 2020-10-07 ENCOUNTER — Ambulatory Visit: Payer: Medicare HMO | Admitting: Cardiology

## 2020-10-07 NOTE — Progress Notes (Signed)
Rescheduled °

## 2020-10-08 ENCOUNTER — Ambulatory Visit: Payer: Medicare HMO | Admitting: Cardiology

## 2020-10-16 ENCOUNTER — Telehealth: Payer: Medicare HMO | Admitting: Cardiology

## 2020-10-16 ENCOUNTER — Other Ambulatory Visit: Payer: Self-pay

## 2020-10-16 ENCOUNTER — Encounter: Payer: Self-pay | Admitting: Cardiology

## 2020-10-16 VITALS — BP 115/68 | HR 60 | Wt 134.0 lb

## 2020-10-16 DIAGNOSIS — I480 Paroxysmal atrial fibrillation: Secondary | ICD-10-CM | POA: Diagnosis not present

## 2020-10-16 DIAGNOSIS — E782 Mixed hyperlipidemia: Secondary | ICD-10-CM | POA: Diagnosis not present

## 2020-10-16 NOTE — Progress Notes (Signed)
Patient referred by Plotnikov, Evie Lacks, MD for paroxysmal atrial fibrillation  Subjective:   Amber Shelton, female    DOB: Oct 24, 1950, 70 y.o.   MRN: [id number]  I connected withthe patient on 1/26/2022by a video enabled telemedicine application and verified that I am speaking with the correct person using two identifiers.    I discussed the limitations of evaluation and management by telemedicine and the availability of in person appointments. The patient expressed understanding and agreed to proceed.   This visit type was conducted due to national recommendations for restrictions regarding the COVID-19 Pandemic (e.g. social distancing). This format is felt to be most appropriate for this patient at this time. All issues noted in this document were discussed and addressed. No physical exam was performed (except for noted visual exam findings with Tele health visits). The patient has consented to conduct a Tele health visit and understands insurance will be billed.    Chief Complaint  Patient presents with  . Paroxysmal atrial fibrillation     HPI  70 year old Caucasian female with paroxysmal atrial fibrillation.   Patient has been off flecainide for over 6 months now. She has not had any palpitations. Her leg edema has completely resolved. She has made changes to her diet, now uses an air fryer. She hopes to reduce her choleterol by diet and lifestyle changes.   Initial consultation HPI 07/2019: patient was previously followed by cardiologist Dr. Wynonia Lawman.  She has been on flecainide for management of paroxysmal atrial fibrillation. Patient has maintained sinus rhythm for 4 years on flecainide.  She reports that she has felt difficulty urinating ever since starting flecainide, which partially improved after reducing morning dose to half tablet a day.  She questions if she can potentially come off flecainide.  Patient has no acutely reversible risk factors for atrial  fibrillation at this time.  She has moderate sleep apnea, but was recommended that she does not need CPAP for the same.    Current Outpatient Medications on File Prior to Visit  Medication Sig Dispense Refill  . ALPRAZolam (XANAX) 0.5 MG tablet Take 1 tablet (0.5 mg total) by mouth 3 (three) times daily as needed. for anxiety 90 tablet 5  . calcium carbonate (OS-CAL) 600 MG TABS Take 600 mg by mouth 2 (two) times daily with a meal. Reported on 02/14/2016    . flecainide (TAMBOCOR) 50 MG tablet Take 0.5 tablets (25 mg total) by mouth 2 (two) times daily. TAKE 1/2 TABLET BY MOUTH IN MORNING AND 1 TABLET IN EVENING (Patient not taking: Reported on 07/15/2020) 90 tablet 3  . furosemide (LASIX) 20 MG tablet Take 0.5-1 tablets (10-20 mg total) by mouth daily as needed for edema. 60 tablet 3  . loratadine (CLARITIN) 10 MG tablet Take 1 tablet (10 mg total) by mouth daily. (Patient taking differently: Take 10 mg by mouth as needed. ) 30 tablet 11  . metoprolol tartrate (LOPRESSOR) 25 MG tablet TAKE ONE TABLET BY MOUTH TWICE A DAY 180 tablet 3  . multivitamin (POLY-VITAMINS) CHEW Chew 1 tablet by mouth daily.      No current facility-administered medications on file prior to visit.    Cardiovascular studies:  EKG 02/15/2020: Sinus rhythm 54 bpm Normal EKG  Lexiscan nuclear stress test 2015: Normal stress nuclear study. and Low risk stress nuclear study with mild breast attenuation artifact..  Echocardiogram 2015: - Left ventricle: The cavity size was normal. Systolic  function was normal. The estimated ejection fraction was  55%.  Wall motion was normal; there were no regional wall  motion abnormalities.  - Atrial septum: No defect or patent foramen ovale was  identified.  - Pericardium, extracardiac: A trivial pericardial effusion  was identified posterior to the heart.    Recent labs: 07/15/2020: Glucose 97, BUN/Cr 14/0.89. EGFR 66. Na/K 137/4.8. Rest of the CMP normal H/H  14/43. MCV 92. Platelets 246 Chol 233, TG 214, HDL 50, LDL 149 TSH 3.3   07/05/2019: Glucose 102, BUN/Cr 19/0.99. EGFR 55. Na/K 136/4.5. Rest of the CMP normal H/H 14/42. MCV 92. Platelets 241 Chol 222, TG 215, HDL 48, LDL 135 TSH 2.3 normal  2015: HbA1C 5.4%    Review of Systems  Cardiovascular: Negative for chest pain, dyspnea on exertion, leg swelling, palpitations and syncope.         Vitals:   10/16/20 0958  BP: 115/68  Pulse: 60    Body mass index is 25.32 kg/m. Filed Weights   10/16/20 0958  Weight: 134 lb (60.8 kg)     Objective:   Physical Exam Vitals and nursing note reviewed.  Constitutional:      General: She is not in acute distress.    Appearance: She is well-developed and well-nourished.  Pulmonary:     Effort: Pulmonary effort is normal.  Neurological:     Mental Status: She is alert and oriented to person, place, and time.  Psychiatric:        Mood and Affect: Mood and affect normal.           Assessment & Recommendations:   70 year old Caucasian female with paroxysmal atrial fibrillation.   Paroxysmal atrial fibrillation: Staying in sinus rhythm off Flecainide. Continue metoprolol tartarate 25 mg bid CHA2DS2VASc score 2, annual stroke risk 2.2%, thus not on anticoagulation.  Primary prtevention: 10 yr ASCVD risk 8.8%. Patient would like avoid being on statin at this time. Discussed diet and lifestyle modifications, including regular physical activity and pesco-meditarraneian diet.   F/u in as needed  Nigel Mormon, MD [facility] Pager: [phone] Office: [phone]

## 2021-01-14 ENCOUNTER — Other Ambulatory Visit: Payer: Self-pay

## 2021-01-14 ENCOUNTER — Ambulatory Visit (INDEPENDENT_AMBULATORY_CARE_PROVIDER_SITE_OTHER): Payer: Medicare HMO | Admitting: Internal Medicine

## 2021-01-14 ENCOUNTER — Encounter: Payer: Self-pay | Admitting: Internal Medicine

## 2021-01-14 DIAGNOSIS — I480 Paroxysmal atrial fibrillation: Secondary | ICD-10-CM

## 2021-01-14 DIAGNOSIS — F419 Anxiety disorder, unspecified: Secondary | ICD-10-CM | POA: Diagnosis not present

## 2021-01-14 DIAGNOSIS — R69 Illness, unspecified: Secondary | ICD-10-CM | POA: Diagnosis not present

## 2021-01-14 DIAGNOSIS — E785 Hyperlipidemia, unspecified: Secondary | ICD-10-CM | POA: Diagnosis not present

## 2021-01-14 NOTE — Assessment & Plan Note (Signed)
Cont w/ Metoprolol

## 2021-01-14 NOTE — Progress Notes (Signed)
Subjective:  Patient ID: Amber Shelton, female    DOB: 05/13/1951  Age: 70 y.o. MRN: [id number]  CC: Follow-up (6 month f/u- Req refill on Metoprolol & Alprazolam)   HPI Amber Shelton presents for anxiety, PAF, dyslipidemia f/u Not vaccinated for COVID  Outpatient Medications Prior to Visit  Medication Sig Dispense Refill  . ALPRAZolam (XANAX) 0.5 MG tablet Take 1 tablet (0.5 mg total) by mouth 3 (three) times daily as needed. for anxiety 90 tablet 5  . calcium carbonate (OS-CAL) 600 MG TABS Take 600 mg by mouth 2 (two) times daily with a meal. Reported on 02/14/2016    . loratadine (CLARITIN) 10 MG tablet Take 1 tablet (10 mg total) by mouth daily. (Patient taking differently: Take 10 mg by mouth as needed.) 30 tablet 11  . metoprolol tartrate (LOPRESSOR) 25 MG tablet TAKE ONE TABLET BY MOUTH TWICE A DAY 180 tablet 3  . multivitamin (POLY-VITAMINS) CHEW Chew 1 tablet by mouth daily.     No facility-administered medications prior to visit.    ROS: Review of Systems  Constitutional: Negative for activity change, appetite change, chills, fatigue and unexpected weight change.  HENT: Negative for congestion, mouth sores and sinus pressure.   Eyes: Negative for visual disturbance.  Respiratory: Negative for cough and chest tightness.   Gastrointestinal: Negative for abdominal pain and nausea.  Genitourinary: Negative for difficulty urinating, frequency and vaginal pain.  Musculoskeletal: Negative for back pain and gait problem.  Skin: Negative for pallor and rash.  Neurological: Negative for dizziness, tremors, weakness, numbness and headaches.  Psychiatric/Behavioral: Negative for confusion and sleep disturbance. The patient is not nervous/anxious.     Objective:  BP 120/72 (BP Location: Left Arm)   Pulse (!) 57   Temp 98.5 F (36.9 C) (Oral)   Ht 5\' 1"  (1.549 m)   Wt 133 lb (60.3 kg)   SpO2 96%   BMI 25.13 kg/m   BP Readings from Last 3 Encounters:  01/14/21 120/72   10/16/20 115/68  07/15/20 118/70    Wt Readings from Last 3 Encounters:  01/14/21 133 lb (60.3 kg)  10/16/20 134 lb (60.8 kg)  07/15/20 132 lb 6.4 oz (60.1 kg)    Physical Exam Constitutional:      General: She is not in acute distress.    Appearance: She is well-developed.  HENT:     Head: Normocephalic.     Right Ear: External ear normal.     Left Ear: External ear normal.     Nose: Nose normal.  Eyes:     General:        Right eye: No discharge.        Left eye: No discharge.     Conjunctiva/sclera: Conjunctivae normal.     Pupils: Pupils are equal, round, and reactive to light.  Neck:     Thyroid: No thyromegaly.     Vascular: No JVD.     Trachea: No tracheal deviation.  Cardiovascular:     Rate and Rhythm: Normal rate and regular rhythm.     Heart sounds: Normal heart sounds.  Pulmonary:     Effort: No respiratory distress.     Breath sounds: No stridor. No wheezing.  Abdominal:     General: Bowel sounds are normal. There is no distension.     Palpations: Abdomen is soft. There is no mass.     Tenderness: There is no abdominal tenderness. There is no guarding or rebound.  Musculoskeletal:  General: No tenderness.     Cervical back: Normal range of motion and neck supple.  Lymphadenopathy:     Cervical: No cervical adenopathy.  Skin:    Findings: No erythema or rash.  Neurological:     Cranial Nerves: No cranial nerve deficit.     Motor: No abnormal muscle tone.     Coordination: Coordination normal.     Deep Tendon Reflexes: Reflexes normal.  Psychiatric:        Behavior: Behavior normal.        Thought Content: Thought content normal.        Judgment: Judgment normal.     Lab Results  Component Value Date   WBC 8.2 07/15/2020   HGB 14.7 07/15/2020   HCT 43.4 07/15/2020   PLT 246.0 07/15/2020   GLUCOSE 97 07/15/2020   CHOL 233 (H) 07/15/2020   TRIG 214.0 (H) 07/15/2020   HDL 50.10 07/15/2020   LDLDIRECT 149.0 07/15/2020   LDLCALC 118  (H) 06/14/2018   ALT 16 07/15/2020   AST 22 07/15/2020   NA 137 07/15/2020   K 4.8 07/15/2020   CL 100 07/15/2020   CREATININE 0.89 07/15/2020   BUN 14 07/15/2020   CO2 31 07/15/2020   TSH 3.03 07/15/2020   HGBA1C 5.4 12/10/2013    US Renal  Result Date: 07/26/2019 CLINICAL DATA:  Initial evaluation for decreased GFR. EXAM: RENAL / URINARY TRACT ULTRASOUND COMPLETE COMPARISON:  Prior ultrasound from 06/12/2014. FINDINGS: Right Kidney: Renal measurements: 9.9 x 3.2 x 3.4 cm = volume: 57.0 mL. Increased echogenicity within the renal parenchyma, suggesting medical renal disease. No nephrolithiasis or hydronephrosis. 1.0 x 0.9 x 1.1 cm minimally complex cyst present within the interpolar region, similar relative to 2015, most likely benign. Left Kidney: Renal measurements: 11.1 x 3.8 x 4.4 cm = volume: 96.6 mL. Increased echogenicity within the renal parenchyma, compatible with medical renal disease. No nephrolithiasis or hydronephrosis. No focal renal mass. Bladder: Appears normal for degree of bladder distention. Bilateral ureteral jets are visualized. Other: None. IMPRESSION: 1. Increased echogenicity within the renal parenchyma, compatible with medical renal disease. 2. No hydronephrosis. 3. 1.1 cm cyst within the interpolar right kidney, grossly stable from 2015, and most likely benign. Electronically Signed   By: Rise Mu M.D.   On: 07/26/2019 21:52    Assessment & Plan:     Follow-up: No follow-ups on file.  Sonda Primes, MD

## 2021-01-14 NOTE — Patient Instructions (Addendum)
For a mild COVID-19 case you can take zinc 50 mg a day for 1 week, vitamin C 1000 mg daily for 1 week, vitamin D2 50,000 units weekly for 2 months (unless you are taking vitamin D daily already), Quercetin 500 mg twice a day for 1 week (if you can get it quick enough). Take Allegra or Benadryl.  Maintain good oral hydration and take Tylenol for high fever.   Cardiac CT calcium scoring test $99 Tel # is [phone]   Computed tomography, more commonly known as a CT or CAT scan, is a diagnostic medical imaging test. Like traditional x-rays, it produces multiple images or pictures of the inside of the body. The cross-sectional images generated during a CT scan can be reformatted in multiple planes. They can even generate three-dimensional images. These images can be viewed on a computer monitor, printed on film or by a 3D printer, or transferred to a CD or DVD. CT images of internal organs, bones, soft tissue and blood vessels provide greater detail than traditional x-rays, particularly of soft tissues and blood vessels. A cardiac CT scan for coronary calcium is a non-invasive way of obtaining information about the presence, location and extent of calcified plaque in the coronary arteries--the vessels that supply oxygen-containing blood to the heart muscle. Calcified plaque results when there is a build-up of fat and other substances under the inner layer of the artery. This material can calcify which signals the presence of atherosclerosis, a disease of the vessel wall, also called coronary artery disease (CAD). People with this disease have an increased risk for heart attacks. In addition, over time, progression of plaque build up (CAD) can narrow the arteries or even close off blood flow to the heart. The result may be chest pain, sometimes called "angina," or a heart attack. Because calcium is a marker of CAD, the amount of calcium detected on a cardiac CT scan is a helpful prognostic tool. The findings  on cardiac CT are expressed as a calcium score. Another name for this test is coronary artery calcium scoring.  What are some common uses of the procedure? The goal of cardiac CT scan for calcium scoring is to determine if CAD is present and to what extent, even if there are no symptoms. It is a screening study that may be recommended by a physician for patients with risk factors for CAD but no clinical symptoms. The major risk factors for CAD are: . high blood cholesterol levels  . family history of heart attacks  . diabetes  . high blood pressure  . cigarette smoking  . overweight or obese  . physical inactivity   A negative cardiac CT scan for calcium scoring shows no calcification within the coronary arteries. This suggests that CAD is absent or so minimal it cannot be seen by this technique. The chance of having a heart attack over the next two to five years is very low under these circumstances. A positive test means that CAD is present, regardless of whether or not the patient is experiencing any symptoms. The amount of calcification--expressed as the calcium score--may help to predict the likelihood of a myocardial infarction (heart attack) in the coming years and helps your medical doctor or cardiologist decide whether the patient may need to take preventive medicine or undertake other measures such as diet and exercise to lower the risk for heart attack. The extent of CAD is graded according to your calcium score:  Calcium Score  Presence of CAD (coronary artery  disease)  0 No evidence of CAD   1-10 Minimal evidence of CAD  11-100 Mild evidence of CAD  101-400 Moderate evidence of CAD  Over 400 Extensive evidence of CAD   Coronary artery calcium (CAC) score is a strong predictor of incident coronary heart disease (CHD) and provides predictive information beyond traditional risk factors. CAC scoring is reasonable to use in the decision to withhold, postpone, or initiate statin  therapy in intermediate-risk or selected borderline-risk asymptomatic adults (age 10-75 years and LDL-C >=70 to <190 mg/dL) who do not have diabetes or established atherosclerotic cardiovascular disease (ASCVD).* In intermediate-risk (10-year ASCVD risk >=7.5% to <20%) adults or selected borderline-risk (10-year ASCVD risk >=5% to <7.5%) adults in whom a CAC score is measured for the purpose of making a treatment decision the following recommendations have been made:   If CAC=0, it is reasonable to withhold statin therapy and reassess in 5 to 10 years, as long as higher risk conditions are absent (diabetes mellitus, family history of premature CHD in first degree relatives (males <55 years; females <65 years), cigarette smoking, or LDL >=190 mg/dL).   If CAC is 1 to 99, it is reasonable to initiate statin therapy for patients >=43 years of age.   If CAC is >=100 or >=75th percentile, it is reasonable to initiate statin therapy at any age.   Cardiology referral should be considered for patients with CAC scores >=400 or >=75th percentile.   *2018 AHA/ACC/AACVPR/AAPA/ABC/ACPM/ADA/AGS/APhA/ASPC/NLA/PCNA Guideline on the Management of Blood Cholesterol: A Report of the American College of Cardiology/American Heart Association Task Force on Clinical Practice Guidelines. J Am Coll Cardiol. 2019;73(24):3168-3209.

## 2021-01-14 NOTE — Assessment & Plan Note (Addendum)
Not on statins F/u w/dr Jacinto Halim 4/22 CT cor calcium test was offered

## 2021-01-14 NOTE — Assessment & Plan Note (Signed)
Xanax prn  Potential benefits of a long term benzodiazepines  use as well as potential risks  and complications were explained to the patient and were aknowledged. 

## 2021-01-23 ENCOUNTER — Other Ambulatory Visit: Payer: Self-pay | Admitting: Internal Medicine

## 2021-01-23 NOTE — Telephone Encounter (Signed)
Patient is requesting a refill of the following medications: Requested Prescriptions   Pending Prescriptions Disp Refills  . ALPRAZolam (XANAX) 0.5 MG tablet [Pharmacy Med Name: ALPRAZolam 0.5 MG TABLET] 90 tablet     Sig: TAKE ONE TABLET BY MOUTH THREE TIMES A DAY AS NEEDED FOR ANXIETY    Date of patient request: 01/23/21  Last office visit: 01/14/21  Date of last refill: 07/15/20  Last refill amount: 90, 5 refills  Follow up time period per chart: 07/16/21

## 2021-04-04 DIAGNOSIS — H5210 Myopia, unspecified eye: Secondary | ICD-10-CM | POA: Diagnosis not present

## 2021-04-07 ENCOUNTER — Telehealth: Payer: Self-pay | Admitting: Internal Medicine

## 2021-04-07 NOTE — Progress Notes (Signed)
  Chronic Care Management   Note  04/07/2021 Name: CILICIA BORDEN MRN: [id number] DOB: 06-15-1951  [name] Hodge is a 70 y.o. year old female who is a primary care patient of Plotnikov, Georgina Quint, MD. I reached out to [name] by phone today in response to a referral sent by Ms. Aneesah W Burkey's PCP, Plotnikov, Georgina Quint, MD.   Ms. Dorman was given information about Chronic Care Management services today including:  CCM service includes personalized support from designated clinical staff supervised by her physician, including individualized plan of care and coordination with other care providers 24/7 contact phone numbers for assistance for urgent and routine care needs. Service will only be billed when office clinical staff spend 20 minutes or more in a month to coordinate care. Only one practitioner may furnish and bill the service in a calendar month. The patient may stop CCM services at any time (effective at the end of the month) by phone call to the office staff.   Patient agreed to services and verbal consent obtained.   Follow up plan:  Leonides Schanz Upstream Scheduler

## 2021-04-30 ENCOUNTER — Other Ambulatory Visit: Payer: Self-pay

## 2021-04-30 ENCOUNTER — Ambulatory Visit (INDEPENDENT_AMBULATORY_CARE_PROVIDER_SITE_OTHER): Payer: Medicare HMO

## 2021-04-30 DIAGNOSIS — I480 Paroxysmal atrial fibrillation: Secondary | ICD-10-CM

## 2021-04-30 DIAGNOSIS — F419 Anxiety disorder, unspecified: Secondary | ICD-10-CM

## 2021-04-30 DIAGNOSIS — J302 Other seasonal allergic rhinitis: Secondary | ICD-10-CM

## 2021-04-30 DIAGNOSIS — E785 Hyperlipidemia, unspecified: Secondary | ICD-10-CM

## 2021-04-30 NOTE — Progress Notes (Addendum)
Chronic Care Management Pharmacy Note  04/30/2021 Name:  Amber Shelton MRN:  [id number] DOB:  19-Jul-1951  Summary: - Patient reports that she feels overall she is doing well, is pleased that she has been able to discontinue flecainide and Afib has been controlled on metoprolol, reports HR has been averaging 68-71 - denies any issues/ palpitations, wears a smart watch that is able to monitor HR -Has been active walking at least 10-12k steps a day, has been watching her diet, trying to limit food intake of foods that are high in cholesterol -Notes that she has been able to reduce amount of alprazolam she has been taking throughout the day since she has retired - now taking 1/2 tablet twice daily, and an additional 1/2 tablet in the afternoon if needed  Recommendations/Changes made from today's visit: - Advised for patient to continue monitoring heart rate continuously as she has been with her watch and to reach out with any issues  -Discussed starting bisphosphate which she was not interested in at this time reviewed with patient exercising caution to avoid falls due to increased fracture risk -Discussed starting statin to reduce LDL and cholesterol, patient did not wish to start at this time, was agreeable to trial of red yeast rice   Subjective: Amber Shelton is an 70 y.o. year old female who is a primary patient of Plotnikov, Evie Lacks, MD.  The CCM team was consulted for assistance with disease management and care coordination needs.    Engaged with patient face to face for initial visit in response to provider referral for pharmacy case management and/or care coordination services.   Consent to Services:  The patient was given the following information about Chronic Care Management services today, agreed to services, and gave verbal consent: 1. CCM service includes personalized support from designated clinical staff supervised by the primary care provider, including individualized plan of  care and coordination with other care providers 2. 24/7 contact phone numbers for assistance for urgent and routine care needs. 3. Service will only be billed when office clinical staff spend 20 minutes or more in a month to coordinate care. 4. Only one practitioner may furnish and bill the service in a calendar month. 5.The patient may stop CCM services at any time (effective at the end of the month) by phone call to the office staff. 6. The patient will be responsible for cost sharing (co-pay) of up to 20% of the service fee (after annual deductible is met). Patient agreed to services and consent obtained.  Patient Care Team: Plotnikov, Evie Lacks, MD as PCP - General (Internal Medicine) Adrian Prows, MD as Consulting Physician (Cardiology) Delice Bison Darnelle Maffucci, [facility] as Pharmacist (Pharmacist)  Recent office visits: 01/14/2021 - Lew Dawes MD (PCP) - 6 month f/u - no changes made to medications at this time   Recent consult visits: 10/16/2020 - Vernell Leep MD - Cardiology - repots to no palpitations since stopping flecainide, leg edema has resolved, positive changes to diet - continue hold of flacainide, continue with metoprolol tartrate   Hospital visits: None in previous 6 months  Objective:  Lab Results  Component Value Date   CREATININE 0.89 07/15/2020   BUN 14 07/15/2020   GFR 66.62 07/15/2020   GFRNONAA 74 (L) 12/10/2013   GFRAA 85 (L) 12/10/2013   NA 137 07/15/2020   K 4.8 07/15/2020   CALCIUM 9.7 07/15/2020   CO2 31 07/15/2020   GLUCOSE 97 07/15/2020    Lab Results  Component  Value Date/Time   HGBA1C 5.4 12/10/2013 04:08 AM   GFR 66.62 07/15/2020 09:43 AM   GFR 55.88 (L) 07/05/2019 10:18 AM    Last diabetic Eye exam:  No results found for: HMDIABEYEEXA  Last diabetic Foot exam:  No results found for: HMDIABFOOTEX   Lab Results  Component Value Date   CHOL 233 (H) 07/15/2020   HDL 50.10 07/15/2020   LDLCALC 118 (H) 06/14/2018   LDLDIRECT 149.0 07/15/2020    TRIG 214.0 (H) 07/15/2020   CHOLHDL 5 07/15/2020    Hepatic Function Latest Ref Rng & Units 07/15/2020 07/05/2019 06/14/2018  Total Protein 6.0 - 8.3 g/dL 6.8 7.2 7.3  Albumin 3.5 - 5.2 g/dL 4.2 4.3 4.3  AST 0 - 37 U/L 22 26 18   ALT 0 - 35 U/L 16 26 14   Alk Phosphatase 39 - 117 U/L 71 81 72  Total Bilirubin 0.2 - 1.2 mg/dL 1.2 1.2 1.1  Bilirubin, Direct 0.0 - 0.3 mg/dL - 0.2 0.2    Lab Results  Component Value Date/Time   TSH 3.03 07/15/2020 09:43 AM   TSH 2.33 07/05/2019 10:18 AM   FREET4 0.92 07/05/2019 10:18 AM   FREET4 0.86 04/27/2014 11:17 AM    CBC Latest Ref Rng & Units 07/15/2020 07/05/2019 06/14/2018  WBC 4.0 - 10.5 K/uL 8.2 9.7 7.4  Hemoglobin 12.0 - 15.0 g/dL 14.7 14.5 15.1(H)  Hematocrit 36.0 - 46.0 % 43.4 42.2 44.5  Platelets 150.0 - 400.0 K/uL 246.0 241.0 239.0    Lab Results  Component Value Date/Time   VD25OH 83.57 07/05/2019 10:18 AM    Clinical ASCVD: No  The 10-year ASCVD risk score Mikey Bussing DC Jr., et al., 2013) is: 8.1%   Values used to calculate the score:     Age: 37 years     Sex: Female     Is Non-Hispanic African American: No     Diabetic: No     Tobacco smoker: No     Systolic Blood Pressure: 518 mmHg     Is BP treated: No     HDL Cholesterol: 50.1 mg/dL     Total Cholesterol: 233 mg/dL    Depression screen [facility] 2/9 07/15/2020 07/05/2019 06/29/2017  Decreased Interest 0 1 1  Down, Depressed, Hopeless 0 1 1  PHQ - 2 Score 0 2 2  Altered sleeping - 0 0  Tired, decreased energy - 0 0  Change in appetite - 0 0  Feeling bad or failure about yourself  - 0 0  Trouble concentrating - 0 0  Moving slowly or fidgety/restless - 0 0  Suicidal thoughts - 0 0  PHQ-9 Score - 2 2  Difficult doing work/chores - Not difficult at all -    Social History   Tobacco Use  Smoking Status Never  Smokeless Tobacco Never   BP Readings from Last 3 Encounters:  01/14/21 120/72  10/16/20 115/68  07/15/20 118/70   Pulse Readings from Last 3 Encounters:   01/14/21 (!) 57  10/16/20 60  07/15/20 (!) 55   Wt Readings from Last 3 Encounters:  01/14/21 133 lb (60.3 kg)  10/16/20 134 lb (60.8 kg)  07/15/20 132 lb 6.4 oz (60.1 kg)   BMI Readings from Last 3 Encounters:  01/14/21 25.13 kg/m  10/16/20 25.32 kg/m  07/15/20 25.02 kg/m    Assessment/Interventions: Review of patient past medical history, allergies, medications, health status, including review of consultants reports, laboratory and other test data, was performed as part of comprehensive evaluation and provision  of chronic care management services.   SDOH:  (Social Determinants of Health) assessments and interventions performed: Yes  SDOH Screenings   Alcohol Screen: Not on file  Depression (PHQ2-9): Low Risk    PHQ-2 Score: 0  Financial Resource Strain: Low Risk    Difficulty of Paying Living Expenses: Not very hard  Food Insecurity: Not on file  Housing: Not on file  Physical Activity: Not on file  Social Connections: Not on file  Stress: Not on file  Tobacco Use: Low Risk    Smoking Tobacco Use: Never   Smokeless Tobacco Use: Never  Transportation Needs: Not on file    [place]  Allergies  Allergen Reactions   Latex Swelling and Other (See Comments)    blisters   Amoxicillin     Per pharmacy   Buspirone Hcl Other (See Comments)    "felt like half body missing"   Contrast Media [Iodinated Diagnostic Agents]    Fluoxetine Hcl Other (See Comments)    Not sure reaction per pt   Meperidine Hcl     REACTION: hypertension   Morphine Nausea And Vomiting   Nortriptyline Hcl Other (See Comments)    nightmares   Orange Fruit [Citrus]     Burning of mouth   Peanuts [Peanut Oil]     Medications Reviewed Today     Reviewed by Tomasa Blase, [facility] (Pharmacist) on 04/30/21 at [place] List Status: <None>   Medication Order Taking? Sig Documenting Provider Last Dose Status Informant  ALPRAZolam (XANAX) 0.5 MG tablet [id number] Yes TAKE ONE TABLET BY  MOUTH THREE TIMES A DAY AS NEEDED FOR ANXIETY  Patient taking differently: Take 0.25 mg by mouth 3 (three) times daily as needed.   Plotnikov, Evie Lacks, MD Taking Active   APPLE CIDER VINEGAR PO [id number] Yes Take 2 tablets by mouth daily. [provider] Taking Active   Cholecalciferol (VITAMIN D3) 50 MCG (2000 UT) CHEW [id number] Yes Chew 1 tablet by mouth daily. [provider] Taking Active   diphenhydrAMINE (BENADRYL) 12.5 MG/5ML liquid [id number] Yes Take 12.5 mg by mouth daily as needed. [provider] Taking Active   ELDERBERRY PO [id number] Yes Take 100 mg by mouth daily. [provider] Taking Active   FIBER PO [id number] Yes Take 5 g by mouth daily. [provider] Taking Active   furosemide (LASIX) 20 MG tablet [id number] Yes Take 20 mg by mouth daily as needed for edema or fluid. [provider] Taking Active   loratadine (CLARITIN) 5 MG/5ML syrup [id number] Yes Take 5 mg by mouth daily as needed for allergies or rhinitis. [provider] Taking Active   metoprolol tartrate (LOPRESSOR) 25 MG tablet [id number] Yes TAKE ONE TABLET BY MOUTH TWICE A DAY Patwardhan, Manish J, MD Taking Active   Multiple Vitamins-Minerals (WOMENS MULTIVITAMIN PO) [id number] Yes Take 2 tablets by mouth daily. Infusion vitamins [provider] Taking Active   multivitamin (POLY-VITAMINS) CHEW [id number] Yes Chew 1 tablet by mouth daily. [provider] Taking Active Self  vitamin C (ASCORBIC ACID) 250 MG tablet [id number] Yes Take 250 mg by mouth daily. [provider] Taking Active   Zinc 30 MG CAPS [id number] Yes Take 1 capsule by mouth daily. [provider] Taking Active             Patient Active Problem List   Diagnosis Date Noted   Mixed hyperlipidemia 04/03/2020   Decreased GFR 07/05/2019   Contact dermatitis 06/28/2018   Anxiety  12/28/2017   Dyslipidemia 12/25/2016   Osteoporosis 06/23/2016   Well  adult exam 12/24/2015   Edema 04/27/2014   Fatigue 01/14/2014   PAF (paroxysmal atrial fibrillation) (HCC) 12/09/2013   Seasonal and perennial allergic rhinitis 07/05/2008   Atrophic vaginitis    Situational stress 07/14/2007   Snoring without sleep apnea 04/15/2007     There is no immunization history on file for this patient.  Conditions to be addressed/monitored:  Hyperlipidemia, Atrial Fibrillation, Anxiety, Osteoporosis, and Allergic Rhinitis  Care Plan : CCM Care Plan  Updates made by Tomasa Blase, RPH since 04/30/2021 12:00 AM     Problem: Afib, HLD, Osteoporosis, Anxiety, Allergic Rhinitis   Priority: High  Onset Date: 04/30/2021     Long-Range Goal: Disease Management   Start Date: 04/30/2021  Expected End Date: 10/31/2021  This Visit's Progress: On track  Priority: High  Note:   Current Barriers:  Unable to independently monitor therapeutic efficacy Unable to achieve control of LDL   Pharmacist Clinical Goal(s):  Patient will achieve adherence to monitoring guidelines and medication adherence to achieve therapeutic efficacy achieve control of LDL as evidenced by next lipid panel maintain control of anxiety/stress and Afib as evidenced by anxiety and stress levels / HR  through collaboration with PharmD and provider.   Interventions: 1:1 collaboration with Plotnikov, Evie Lacks, MD regarding development and update of comprehensive plan of care as evidenced by provider attestation and co-signature Inter-disciplinary care team collaboration (see longitudinal plan of care) Comprehensive medication review performed; medication list updated in electronic medical record  Hyperlipidemia: (LDL goal < 100) -Not ideally controlled -Last LDL level 149.0 mg/dL (06/22/2020) -Current treatment: N/a -Medications previously tried: fish oils   -Current dietary patterns: reports to a diet that consists mainly of vegetables, eats a good amount of fish, does have other proteins  as well, low in foods high in cholesterol -Current exercise habits: walking 10-12k steps daily  -Educated on Cholesterol goals;  Benefits of statin for ASCVD risk reduction; Importance of limiting foods high in cholesterol; Exercise goal of 150 minutes per week; -Discussed with patient about concern for current LDL level, discussed possible statin initiation which she was not interested in starting, suggested trial of red yeast rice supplement which she was agreeable to starting   Atrial Fibrillation (Goal: prevent stroke and major bleeding) -Controlled - sinus rhythm maintained for 4 years (while on flecainide) -CHADSVASC: 2 -Current treatment: Rate control: metoprolol tartrate 12m - 1 tablet twice daily Anticoagulation: n/a - not started due to low CHADSVASc - has taken aspirin in past and was unable to tolerate  Additional Cardiac Medications - Furosemide 213m- 1 tablet daily as needed for edema (uses ~once every 3 months) -Medications previously tried: flecainide, furosemide, aspirin -Home BP and HR readings: HR averaging 68-70 bpm  -Counseled on monitoring of HR, reaching out to office with any issues or concerns regarding HR / palpitations  -Counseled on diet and exercise extensively Recommended to continue current medication  Anxiety (Goal: prevention / control of anxiety attacks) -Controlled -Current treatment: Alprazolam 0.61m59m 1/2 tablet twice daily, at times may use 1/2 tablet in afternoon if needed  -Medications previously tried/failed: n/a -GAD7: 7 -Educated on Benefits of medication for symptom control -Recommended to continue current medication  Osteoporosis Goal prevention of fractures) -Not ideally controlled -Last DEXA Scan: 11/2016   T-Score femoral neck: -2.4  T-Score left femoral neck: -2.5  T-Score lumbar spine: -3.5 -Patient is a candidate for pharmacologic treatment due to T-Score < -  2.5 in femoral neck and T-Score < -2.5 in lumbar spine -Current  treatment  Vitamin D3 2000units - 1 tablet daily   -Medications previously tried: n/a  -Recommend [id number] units of vitamin D daily. Recommend 1200 mg of calcium daily from dietary and supplemental sources. -Discussed with patient about starting a bisphosphonate to lower risk of fracture, patient is not interested in starting a new medication at this time  Allergic Rhinitis (Goal: Control / prevention of allergies) -Controlled -Current treatment  Loratadine 8m/ 548msyrup - 45m53maily if needed  Diphenhydramine 12.45mg10mL - 12.45mg 62mly if needed (does not take if she has already taken loratadine) -Medications previously tried: n/a  -Recommended to continue current medication  Health Maintenance -Vaccine gaps: Tdap, Shingles, pneumonia, influenza -Current therapy:  Multivitamin - 1 tablet daily  Vitamin C 250mg 11mtablet daily  Womens Multivitamin  - 2 tablets (1 serving) daily  Zinc 30mg -47mablet daily  Fiber 5g - 1 tablet daily  Elderberry 100mg - 7mblet daily  Apple Cider Vinegar - 2 tablets daily  -Educated on Herbal supplement research is limited and benefits usually cannot be proven Cost vs benefit of each product must be carefully weighed by individual consumer -Patient is satisfied with current therapy and denies issues -Recommended to continue current medication  Patient Goals/Self-Care Activities Patient will:  - take medications as prescribed target a minimum of 150 minutes of moderate intensity exercise weekly  Follow Up Plan: Telephone follow up appointment with care management team member scheduled for: 3 months  The patient has been provided with contact information for the care management team and has been advised to call with any health related questions or concerns.  Next PCP appointment scheduled for: 07/16/2021       Medication Assistance: None required.  Patient affirms current coverage meets needs.  Patient's preferred [facility] is:  [facility] [phone] box? Yes Pt endorses 100% compliance  Care Plan and Follow Up Patient Decision:  Patient agrees to Care Plan and Follow-up.  Plan: Telephone follow up appointment with care management team member scheduled for:  3 months , The patient has been provided with contact information for the care management team and has been advised to call with any health related questions or concerns. , and Next PCP appointment scheduled for: 07/18/2021  Zerek Litsey CTomasa Blase Clinical Pharmacist, [place] examination/treatment/procedure(s) were performed by non-physician practitioner and as supervising physician I was immediately available for consultation/collaboration.  I agree with above. Aleksei Lew Dawes

## 2021-04-30 NOTE — Patient Instructions (Signed)
Visit Information   PATIENT GOALS:   Goals Addressed             This Visit's Progress    Prevent Falls and Broken Bones-Osteoporosis       Timeframe:  Long-Range Goal Priority:  High Start Date:   04/30/2021                          Expected End Date:  10/31/2021                     Follow Up Date 07/31/2021   - always use handrails on the stairs - always wear shoes or slippers with non-slip sole - get at least 10 minutes of activity every day - keep cell phone with me always - make an emergency alert plan in case I fall - pick up clutter from the floors - remove, or use a non-slip pad, with my throw rugs - wear low heeled or flat shoes with non-skid soles    Why is this important?   When you fall, there are 3 things that control if a bone breaks or not.  These are the fall itself, how hard and the direction that you fall and how fragile your bones are.  Preventing falls is very important for you because of fragile bones.       Track and Manage Heart Rate and Rhythm-Atrial Fibrillation       Timeframe:  Long-Range Goal Priority:  High Start Date:   04/30/2021                          Expected End Date: 10/31/2021                      Follow Up Date 07/31/2021   - check pulse (heart) rate once a day - make a plan to exercise regularly - make a plan to eat healthy - keep all lab appointments - take medicine as prescribed    Why is this important?   Atrial fibrillation may have no symptoms. Sometimes the symptoms get worse or happen more often.  It is important to keep track of what your symptoms are and when they happen.  A change in symptoms is important to discuss with your doctor or nurse.  Being active and healthy eating will also help you manage your heart condition.          Consent to CCM Services: Ms. Haberkorn was given information about Chronic Care Management services including:  CCM service includes personalized support from designated clinical staff  supervised by her physician, including individualized plan of care and coordination with other care providers 24/7 contact phone numbers for assistance for urgent and routine care needs. Service will only be billed when office clinical staff spend 20 minutes or more in a month to coordinate care. Only one practitioner may furnish and bill the service in a calendar month. The patient may stop CCM services at any time (effective at the end of the month) by phone call to the office staff. The patient will be responsible for cost sharing (co-pay) of up to 20% of the service fee (after annual deductible is met).  Patient agreed to services and verbal consent obtained.   Patient verbalizes understanding of instructions provided today and agrees to view in [place].   Telephone follow up appointment with care management team member scheduled for: 3  months  The patient has been provided with contact information for the care management team and has been advised to call with any health related questions or concerns.   Tomasa Blase, PharmD Clinical Pharmacist, [place]   CLINICAL CARE PLAN: Patient Care Plan: CCM Care Plan     Problem Identified: Afib, HLD, Osteoporosis, Anxiety, Allergic Rhinitis   Priority: High  Onset Date: 04/30/2021     Long-Range Goal: Disease Management   Start Date: 04/30/2021  Expected End Date: 10/31/2021  This Visit's Progress: On track  Priority: High  Note:   Current Barriers:  Unable to independently monitor therapeutic efficacy Unable to achieve control of LDL   Pharmacist Clinical Goal(s):  Patient will achieve adherence to monitoring guidelines and medication adherence to achieve therapeutic efficacy achieve control of LDL as evidenced by next lipid panel maintain control of anxiety/stress and Afib as evidenced by anxiety and stress levels / HR  through collaboration with PharmD and provider.   Interventions: 1:1 collaboration with  Plotnikov, Evie Lacks, MD regarding development and update of comprehensive plan of care as evidenced by provider attestation and co-signature Inter-disciplinary care team collaboration (see longitudinal plan of care) Comprehensive medication review performed; medication list updated in electronic medical record  Hyperlipidemia: (LDL goal < 100) -Not ideally controlled -Last LDL level 149.0 mg/dL (06/22/2020) -Current treatment: N/a -Medications previously tried: fish oils   -Current dietary patterns: reports to a diet that consists mainly of vegetables, eats a good amount of fish, does have other proteins as well, low in foods high in cholesterol -Current exercise habits: walking 10-12k steps daily  -Educated on Cholesterol goals;  Benefits of statin for ASCVD risk reduction; Importance of limiting foods high in cholesterol; Exercise goal of 150 minutes per week; -Discussed with patient about concern for current LDL level, discussed possible statin initiation which she was not interested in starting, suggested trial of red yeast rice supplement which she was agreeable to starting   Atrial Fibrillation (Goal: prevent stroke and major bleeding) -Controlled - sinus rhythm maintained for 4 years (while on flecainide) -CHADSVASC: 2 -Current treatment: Rate control: metoprolol tartrate 37m - 1 tablet twice daily Anticoagulation: n/a - not started due to low CHADSVASc - has taken aspirin in past and was unable to tolerate  Additional Cardiac Medications - Furosemide 219m- 1 tablet daily as needed for edema (uses ~once every 3 months) -Medications previously tried: flecainide, furosemide, aspirin -Home BP and HR readings: HR averaging 68-70 bpm  -Counseled on monitoring of HR, reaching out to office with any issues or concerns regarding HR / palpitations  -Counseled on diet and exercise extensively Recommended to continue current medication  Anxiety (Goal: prevention / control of anxiety  attacks) -Controlled -Current treatment: Alprazolam 0.29m1m 1/2 tablet twice daily, at times may use 1/2 tablet in afternoon if needed  -Medications previously tried/failed: n/a -GAD7: 7 -Educated on Benefits of medication for symptom control -Recommended to continue current medication  Osteoporosis Goal prevention of fractures) -Not ideally controlled -Last DEXA Scan: 11/2016   T-Score femoral neck: -2.4  T-Score left femoral neck: -2.5  T-Score lumbar spine: -3.5 -Patient is a candidate for pharmacologic treatment due to T-Score < -2.5 in femoral neck and T-Score < -2.5 in lumbar spine -Current treatment  Vitamin D3 2000units - 1 tablet daily   -Medications previously tried: n/a  -Recommend [phone] units of vitamin D daily. Recommend 1200 mg of calcium daily from dietary and supplemental sources. -Discussed with patient about starting  a bisphosphonate to lower risk of fracture, patient is not interested in starting a new medication at this time  Allergic Rhinitis (Goal: Control / prevention of allergies) -Controlled -Current treatment  Loratadine 69m/ 5241msyrup - 41m67maily if needed  Diphenhydramine 12.41mg14mL - 12.41mg 76mly if needed (does not take if she has already taken loratadine) -Medications previously tried: n/a  -Recommended to continue current medication  Health Maintenance -Vaccine gaps: Tdap, Shingles, pneumonia, influenza -Current therapy:  Multivitamin - 1 tablet daily  Vitamin C 250mg 11mtablet daily  Womens Multivitamin  - 2 tablets (1 serving) daily  Zinc 30mg -71mablet daily  Fiber 5g - 1 tablet daily  Elderberry 100mg - 7mblet daily  Apple Cider Vinegar - 2 tablets daily  -Educated on Herbal supplement research is limited and benefits usually cannot be proven Cost vs benefit of each product must be carefully weighed by individual consumer -Patient is satisfied with current therapy and denies issues -Recommended to continue current  medication  Patient Goals/Self-Care Activities Patient will:  - take medications as prescribed target a minimum of 150 minutes of moderate intensity exercise weekly  Follow Up Plan: Telephone follow up appointment with care management team member scheduled for: 3 months  The patient has been provided with contact information for the care management team and has been advised to call with any health related questions or concerns.  Next PCP appointment scheduled for: 07/16/2021

## 2021-06-08 DIAGNOSIS — L03211 Cellulitis of face: Secondary | ICD-10-CM | POA: Diagnosis not present

## 2021-07-16 ENCOUNTER — Ambulatory Visit (INDEPENDENT_AMBULATORY_CARE_PROVIDER_SITE_OTHER): Payer: Medicare HMO | Admitting: Internal Medicine

## 2021-07-16 ENCOUNTER — Encounter: Payer: Self-pay | Admitting: Internal Medicine

## 2021-07-16 ENCOUNTER — Other Ambulatory Visit: Payer: Self-pay

## 2021-07-16 DIAGNOSIS — E782 Mixed hyperlipidemia: Secondary | ICD-10-CM | POA: Diagnosis not present

## 2021-07-16 DIAGNOSIS — I480 Paroxysmal atrial fibrillation: Secondary | ICD-10-CM

## 2021-07-16 DIAGNOSIS — R69 Illness, unspecified: Secondary | ICD-10-CM | POA: Diagnosis not present

## 2021-07-16 DIAGNOSIS — E785 Hyperlipidemia, unspecified: Secondary | ICD-10-CM | POA: Diagnosis not present

## 2021-07-16 DIAGNOSIS — F419 Anxiety disorder, unspecified: Secondary | ICD-10-CM | POA: Diagnosis not present

## 2021-07-16 DIAGNOSIS — R944 Abnormal results of kidney function studies: Secondary | ICD-10-CM | POA: Diagnosis not present

## 2021-07-16 LAB — COMPREHENSIVE METABOLIC PANEL
ALT: 24 U/L (ref 0–35)
AST: 25 U/L (ref 0–37)
Albumin: 4.4 g/dL (ref 3.5–5.2)
Alkaline Phosphatase: 77 U/L (ref 39–117)
BUN: 21 mg/dL (ref 6–23)
CO2: 32 mEq/L (ref 19–32)
Calcium: 9.9 mg/dL (ref 8.4–10.5)
Chloride: 99 mEq/L (ref 96–112)
Creatinine, Ser: 1.04 mg/dL (ref 0.40–1.20)
GFR: 54.88 mL/min — ABNORMAL LOW (ref >60.00)
Glucose, Bld: 98 mg/dL (ref 70–99)
Potassium: 4.9 mEq/L (ref 3.5–5.1)
Sodium: 137 mEq/L (ref 135–145)
Total Bilirubin: 1.4 mg/dL — ABNORMAL HIGH (ref 0.2–1.2)
Total Protein: 7.3 g/dL (ref 6.0–8.3)

## 2021-07-16 LAB — URINALYSIS
Bilirubin Urine: NEGATIVE
Hgb urine dipstick: NEGATIVE
Ketones, ur: NEGATIVE
Leukocytes,Ua: NEGATIVE
Nitrite: NEGATIVE
Specific Gravity, Urine: 1.025 (ref 1.000–1.030)
Total Protein, Urine: NEGATIVE
Urine Glucose: NEGATIVE
Urobilinogen, UA: 0.2 (ref 0.0–1.0)
pH: 6 (ref 5.0–8.0)

## 2021-07-16 LAB — CBC WITH DIFFERENTIAL/PLATELET
Basophils Absolute: 0 10*3/uL (ref 0.0–0.1)
Basophils Relative: 0.5 % (ref 0.0–3.0)
Eosinophils Absolute: 0.3 10*3/uL (ref 0.0–0.7)
Eosinophils Relative: 3.1 % (ref 0.0–5.0)
HCT: 43.2 % (ref 36.0–46.0)
Hemoglobin: 14.5 g/dL (ref 12.0–15.0)
Lymphocytes Relative: 40.4 % (ref 12.0–46.0)
Lymphs Abs: 3.5 10*3/uL (ref 0.7–4.0)
MCHC: 33.6 g/dL (ref 30.0–36.0)
MCV: 91.8 fl (ref 78.0–100.0)
Monocytes Absolute: 0.6 10*3/uL (ref 0.1–1.0)
Monocytes Relative: 6.4 % (ref 3.0–12.0)
Neutro Abs: 4.2 10*3/uL (ref 1.4–7.7)
Neutrophils Relative %: 49.6 % (ref 43.0–77.0)
Platelets: 246 10*3/uL (ref 150.0–400.0)
RBC: 4.7 Mil/uL (ref 3.87–5.11)
RDW: 13.3 % (ref 11.5–15.5)
WBC: 8.6 10*3/uL (ref 4.0–10.5)

## 2021-07-16 LAB — LIPID PANEL
Cholesterol: 252 mg/dL — ABNORMAL HIGH (ref 0–200)
HDL: 52.4 mg/dL (ref >39.00)
LDL Cholesterol: 164 mg/dL — ABNORMAL HIGH (ref 0–99)
NonHDL: 199.57
Total CHOL/HDL Ratio: 5
Triglycerides: 178 mg/dL — ABNORMAL HIGH (ref 0.0–149.0)
VLDL: 35.6 mg/dL (ref 0.0–40.0)

## 2021-07-16 LAB — TSH: TSH: 2.31 u[IU]/mL (ref 0.35–5.50)

## 2021-07-16 NOTE — Addendum Note (Signed)
Addended by: Waldemar Dickens B on: 07/16/2021 10:31 AM   Modules accepted: Orders

## 2021-07-16 NOTE — Assessment & Plan Note (Signed)
Monitor GFR 

## 2021-07-16 NOTE — Assessment & Plan Note (Signed)
Cont on Metoprolol 

## 2021-07-16 NOTE — Patient Instructions (Signed)

## 2021-07-16 NOTE — Assessment & Plan Note (Signed)
Mild  F/u w/Dr Jacinto Halim

## 2021-07-16 NOTE — Assessment & Plan Note (Signed)
Pt declined statins She will try Red rice yeast

## 2021-07-16 NOTE — Assessment & Plan Note (Signed)
Xanax prn  Potential benefits of a long term benzodiazepines  use as well as potential risks  and complications were explained to the patient and were aknowledged. 

## 2021-07-16 NOTE — Progress Notes (Signed)
Subjective:  Patient ID: Amber Shelton, female    DOB: [birth year]  Age: 70 y.o. MRN: [id number]  CC: Follow-up (6 month f/u)   HPI [name] presents for anxiety, A fib, dyslipidemia f/u  Outpatient Medications Prior to Visit  Medication Sig Dispense Refill   ALPRAZolam (XANAX) 0.5 MG tablet TAKE ONE TABLET BY MOUTH THREE TIMES A DAY AS NEEDED FOR ANXIETY (Patient taking differently: Take 0.25 mg by mouth 3 (three) times daily as needed.) 90 tablet 1   APPLE CIDER VINEGAR PO Take 2 tablets by mouth daily.     Cholecalciferol (VITAMIN D3) 50 MCG (2000 UT) CHEW Chew 1 tablet by mouth daily.     diphenhydrAMINE (BENADRYL) 12.5 MG/5ML liquid Take 12.5 mg by mouth daily as needed.     ELDERBERRY PO Take 100 mg by mouth daily.     FIBER PO Take 5 g by mouth daily.     furosemide (LASIX) 20 MG tablet Take 20 mg by mouth daily as needed for edema or fluid.     loratadine (CLARITIN) 5 MG/5ML syrup Take 5 mg by mouth daily as needed for allergies or rhinitis.     metoprolol tartrate (LOPRESSOR) 25 MG tablet TAKE ONE TABLET BY MOUTH TWICE A DAY 180 tablet 3   Multiple Vitamins-Minerals (WOMENS MULTIVITAMIN PO) Take 2 tablets by mouth daily. Infusion vitamins     multivitamin (POLY-VITAMINS) CHEW Chew 1 tablet by mouth daily.     vitamin C (ASCORBIC ACID) 250 MG tablet Take 250 mg by mouth daily.     Zinc 30 MG CAPS Take 1 capsule by mouth daily.     No facility-administered medications prior to visit.    ROS: Review of Systems  Constitutional:  Negative for activity change, appetite change, chills, fatigue and unexpected weight change.  HENT:  Negative for congestion, mouth sores and sinus pressure.   Eyes:  Negative for visual disturbance.  Respiratory:  Negative for cough and chest tightness.   Gastrointestinal:  Negative for abdominal pain and nausea.  Genitourinary:  Negative for difficulty urinating, frequency and vaginal pain.  Musculoskeletal:  Negative for back pain and gait  problem.  Skin:  Negative for pallor and rash.  Neurological:  Negative for dizziness, tremors, weakness, numbness and headaches.  Psychiatric/Behavioral:  Negative for confusion, sleep disturbance and suicidal ideas. The patient is nervous/anxious.    Objective:  BP 130/72 (BP Location: Left Arm)   Pulse (!) 59   Temp 98.6 F (37 C) (Oral)   Ht 5\' 1"  (1.549 m)   Wt 132 lb 12.8 oz (60.2 kg)   SpO2 94%   BMI 25.09 kg/m   BP Readings from Last 3 Encounters:  07/16/21 130/72  01/14/21 120/72  10/16/20 115/68    Wt Readings from Last 3 Encounters:  07/16/21 132 lb 12.8 oz (60.2 kg)  01/14/21 133 lb (60.3 kg)  10/16/20 134 lb (60.8 kg)    Physical Exam Constitutional:      General: She is not in acute distress.    Appearance: She is well-developed.  HENT:     Head: Normocephalic.     Right Ear: External ear normal.     Left Ear: External ear normal.     Nose: Nose normal.  Eyes:     General:        Right eye: No discharge.        Left eye: No discharge.     Conjunctiva/sclera: Conjunctivae normal.     Pupils:  Pupils are equal, round, and reactive to light.  Neck:     Thyroid: No thyromegaly.     Vascular: No JVD.     Trachea: No tracheal deviation.  Cardiovascular:     Rate and Rhythm: Normal rate and regular rhythm.     Heart sounds: Normal heart sounds.  Pulmonary:     Effort: No respiratory distress.     Breath sounds: No stridor. No wheezing.  Abdominal:     General: Bowel sounds are normal. There is no distension.     Palpations: Abdomen is soft. There is no mass.     Tenderness: There is no abdominal tenderness. There is no guarding or rebound.  Musculoskeletal:        General: No tenderness.     Cervical back: Normal range of motion and neck supple. No rigidity.  Lymphadenopathy:     Cervical: No cervical adenopathy.  Skin:    Findings: No erythema or rash.  Neurological:     Mental Status: She is oriented to person, place, and time.     Cranial  Nerves: No cranial nerve deficit.     Motor: No abnormal muscle tone.     Coordination: Coordination normal.     Deep Tendon Reflexes: Reflexes normal.  Psychiatric:        Behavior: Behavior normal.        Thought Content: Thought content normal.        Judgment: Judgment normal.    Lab Results  Component Value Date   WBC 8.2 07/15/2020   HGB 14.7 07/15/2020   HCT 43.4 07/15/2020   PLT 246.0 07/15/2020   GLUCOSE 97 07/15/2020   CHOL 233 (H) 07/15/2020   TRIG 214.0 (H) 07/15/2020   HDL 50.10 07/15/2020   LDLDIRECT 149.0 07/15/2020   LDLCALC 118 (H) 06/14/2018   ALT 16 07/15/2020   AST 22 07/15/2020   NA 137 07/15/2020   K 4.8 07/15/2020   CL 100 07/15/2020   CREATININE 0.89 07/15/2020   BUN 14 07/15/2020   CO2 31 07/15/2020   TSH 3.03 07/15/2020   HGBA1C 5.4 12/10/2013    US Renal  Result Date: 07/26/2019 CLINICAL DATA:  Initial evaluation for decreased GFR. EXAM: RENAL / URINARY TRACT ULTRASOUND COMPLETE COMPARISON:  Prior ultrasound from 06/12/2014. FINDINGS: Right Kidney: Renal measurements: 9.9 x 3.2 x 3.4 cm = volume: 57.0 mL. Increased echogenicity within the renal parenchyma, suggesting medical renal disease. No nephrolithiasis or hydronephrosis. 1.0 x 0.9 x 1.1 cm minimally complex cyst present within the interpolar region, similar relative to 2015, most likely benign. Left Kidney: Renal measurements: 11.1 x 3.8 x 4.4 cm = volume: 96.6 mL. Increased echogenicity within the renal parenchyma, compatible with medical renal disease. No nephrolithiasis or hydronephrosis. No focal renal mass. Bladder: Appears normal for degree of bladder distention. Bilateral ureteral jets are visualized. Other: None. IMPRESSION: 1. Increased echogenicity within the renal parenchyma, compatible with medical renal disease. 2. No hydronephrosis. 3. 1.1 cm cyst within the interpolar right kidney, grossly stable from 2015, and most likely benign. Electronically Signed   By: Rise Mu M.D.    On: 07/26/2019 21:52    Assessment & Plan:   Problem List Items Addressed This Visit     PAF (paroxysmal atrial fibrillation) (HCC) (Chronic)    Cont on Metoprolol      Anxiety    Xanax prn  Potential benefits of a long term benzodiazepines  use as well as potential risks  and complications were explained  to the patient and were aknowledged.       Decreased GFR    Monitor GFR      Dyslipidemia    Mild  F/u w/Dr Jacinto Halim      Mixed hyperlipidemia    Pt declined statins She will try Red rice yeast         No orders of the defined types were placed in this encounter.     Follow-up: Return in about 6 months (around 01/14/2022) for Wellness Exam.  Sonda Primes, MD

## 2021-07-21 ENCOUNTER — Other Ambulatory Visit: Payer: Self-pay | Admitting: Internal Medicine

## 2021-07-29 ENCOUNTER — Other Ambulatory Visit: Payer: Self-pay | Admitting: Cardiology

## 2021-07-29 DIAGNOSIS — I480 Paroxysmal atrial fibrillation: Secondary | ICD-10-CM

## 2021-07-30 ENCOUNTER — Ambulatory Visit (INDEPENDENT_AMBULATORY_CARE_PROVIDER_SITE_OTHER): Payer: Medicare HMO

## 2021-07-30 ENCOUNTER — Other Ambulatory Visit: Payer: Self-pay

## 2021-07-30 DIAGNOSIS — F419 Anxiety disorder, unspecified: Secondary | ICD-10-CM

## 2021-07-30 DIAGNOSIS — E785 Hyperlipidemia, unspecified: Secondary | ICD-10-CM

## 2021-07-30 DIAGNOSIS — E782 Mixed hyperlipidemia: Secondary | ICD-10-CM

## 2021-07-30 NOTE — Progress Notes (Addendum)
Chronic Care Management Pharmacy Note  07/30/2021 Name:  Amber Shelton MRN:  [id number] DOB:  1951-03-21  Summary: - Patient reports that she has not yet started red yeast rice to help lower her LDL, reports to taking a few doses but started to feel "funny" - was going on vacation and wanted to retrial after her vacation -Since last lipid panel LDL has increased slightly - latest check was 164 -Patient continued with daily exercise, walking 10-14k steps/day - reports to home workouts ~3 times per week - hopes to return to gym workouts soon as well -Patient notes that she has been watching her diet and following low cholesterol diet plans   Recommendations/Changes made from today's visit: - As patient declined statin therapy at this time, recommended for patient to retrial red yeast rice taking 1 capsule every other day to start and increasing as she is able, patient agreeable to plan   Subjective: Amber Shelton is an 70 y.o. year old female who is a primary patient of Plotnikov, Evie Lacks, MD.  The CCM team was consulted for assistance with disease management and care coordination needs.    Engaged with patient face to face for initial visit in response to provider referral for pharmacy case management and/or care coordination services.   Consent to Services:  The patient was given the following information about Chronic Care Management services today, agreed to services, and gave verbal consent: 1. CCM service includes personalized support from designated clinical staff supervised by the primary care provider, including individualized plan of care and coordination with other care providers 2. 24/7 contact phone numbers for assistance for urgent and routine care needs. 3. Service will only be billed when office clinical staff spend 20 minutes or more in a month to coordinate care. 4. Only one practitioner may furnish and bill the service in a calendar month. 5.The patient may stop CCM  services at any time (effective at the end of the month) by phone call to the office staff. 6. The patient will be responsible for cost sharing (co-pay) of up to 20% of the service fee (after annual deductible is met). Patient agreed to services and consent obtained.  Patient Care Team: Plotnikov, Evie Lacks, MD as PCP - General (Internal Medicine) Adrian Prows, MD as Consulting Physician (Cardiology) Delice Bison Darnelle Maffucci, [facility] as Pharmacist (Pharmacist)  Recent office visits: 07/16/2021 - Dr. Alain Marion - 6 month f/u - patient declined statins, agreeable to trial of red yeast rice   Recent consult visits: None since last visit   Hospital visits: None in previous 6 months  Objective:  Lab Results  Component Value Date   CREATININE 1.04 07/16/2021   BUN 21 07/16/2021   GFR 54.88 (L) 07/16/2021   GFRNONAA 74 (L) 12/10/2013   GFRAA 85 (L) 12/10/2013   NA 137 07/16/2021   K 4.9 07/16/2021   CALCIUM 9.9 07/16/2021   CO2 32 07/16/2021   GLUCOSE 98 07/16/2021    Lab Results  Component Value Date/Time   HGBA1C 5.4 12/10/2013 04:08 AM   GFR 54.88 (L) 07/16/2021 10:31 AM   GFR 66.62 07/15/2020 09:43 AM    Last diabetic Eye exam:  No results found for: HMDIABEYEEXA  Last diabetic Foot exam:  No results found for: HMDIABFOOTEX   Lab Results  Component Value Date   CHOL 252 (H) 07/16/2021   HDL 52.40 07/16/2021   LDLCALC 164 (H) 07/16/2021   LDLDIRECT 149.0 07/15/2020   TRIG 178.0 (H) 07/16/2021  CHOLHDL 5 07/16/2021    Hepatic Function Latest Ref Rng & Units 07/16/2021 07/15/2020 07/05/2019  Total Protein 6.0 - 8.3 g/dL 7.3 6.8 7.2  Albumin 3.5 - 5.2 g/dL 4.4 4.2 4.3  AST 0 - 37 U/L 25 22 26   ALT 0 - 35 U/L 24 16 26   Alk Phosphatase 39 - 117 U/L 77 71 81  Total Bilirubin 0.2 - 1.2 mg/dL 1.4(H) 1.2 1.2  Bilirubin, Direct 0.0 - 0.3 mg/dL - - 0.2    Lab Results  Component Value Date/Time   TSH 2.31 07/16/2021 10:31 AM   TSH 3.03 07/15/2020 09:43 AM   FREET4 0.92 07/05/2019  10:18 AM   FREET4 0.86 04/27/2014 11:17 AM    CBC Latest Ref Rng & Units 07/16/2021 07/15/2020 07/05/2019  WBC 4.0 - 10.5 K/uL 8.6 8.2 9.7  Hemoglobin 12.0 - 15.0 g/dL 14.5 14.7 14.5  Hematocrit 36.0 - 46.0 % 43.2 43.4 42.2  Platelets 150.0 - 400.0 K/uL 246.0 246.0 241.0    Lab Results  Component Value Date/Time   VD25OH 83.57 07/05/2019 10:18 AM    Clinical ASCVD: No  The 10-year ASCVD risk score (Arnett DK, et al., 2019) is: 9.6%   Values used to calculate the score:     Age: 70 years     Sex: Female     Is Non-Hispanic African American: No     Diabetic: No     Tobacco smoker: No     Systolic Blood Pressure: 707 mmHg     Is BP treated: No     HDL Cholesterol: 52.4 mg/dL     Total Cholesterol: 252 mg/dL    Depression screen [facility] 2/9 07/16/2021 07/15/2020 07/05/2019  Decreased Interest 0 0 1  Down, Depressed, Hopeless 0 0 1  PHQ - 2 Score 0 0 2  Altered sleeping 0 - 0  Tired, decreased energy 0 - 0  Change in appetite 0 - 0  Feeling bad or failure about yourself  0 - 0  Trouble concentrating 0 - 0  Moving slowly or fidgety/restless 0 - 0  Suicidal thoughts 0 - 0  PHQ-9 Score 0 - 2  Difficult doing work/chores - - Not difficult at all    Social History   Tobacco Use  Smoking Status Never  Smokeless Tobacco Never   BP Readings from Last 3 Encounters:  07/16/21 130/72  01/14/21 120/72  10/16/20 115/68   Pulse Readings from Last 3 Encounters:  07/16/21 (!) 59  01/14/21 (!) 57  10/16/20 60   Wt Readings from Last 3 Encounters:  07/16/21 132 lb 12.8 oz (60.2 kg)  01/14/21 133 lb (60.3 kg)  10/16/20 134 lb (60.8 kg)   BMI Readings from Last 3 Encounters:  07/16/21 25.09 kg/m  01/14/21 25.13 kg/m  10/16/20 25.32 kg/m    Assessment/Interventions: Review of patient past medical history, allergies, medications, health status, including review of consultants reports, laboratory and other test data, was performed as part of comprehensive evaluation and  provision of chronic care management services.   SDOH:  (Social Determinants of Health) assessments and interventions performed: Yes  SDOH Screenings   Alcohol Screen: Not on file  Depression (PHQ2-9): Low Risk    PHQ-2 Score: 0  Financial Resource Strain: Low Risk    Difficulty of Paying Living Expenses: Not very hard  Food Insecurity: Not on file  Housing: Not on file  Physical Activity: Not on file  Social Connections: Not on file  Stress: Not on file  Tobacco Use:  Low Risk    Smoking Tobacco Use: Never   Smokeless Tobacco Use: Never   Passive Exposure: Not on file  Transportation Needs: Not on file    CCM Care Plan  Allergies  Allergen Reactions   Latex Swelling and Other (See Comments)    blisters   Amoxicillin     Per pharmacy   Buspirone Hcl Other (See Comments)    "felt like half body missing"   Contrast Media [Iodinated Diagnostic Agents]    Fluoxetine Hcl Other (See Comments)    Not sure reaction per pt   Meperidine Hcl     REACTION: hypertension   Morphine Nausea And Vomiting   Nortriptyline Hcl Other (See Comments)    nightmares   Orange Fruit [Citrus]     Burning of mouth   Peanuts [Peanut Oil]     Medications Reviewed Today     Reviewed by Cassandria Anger, MD (Physician) on 07/16/21 at 1016  Med List Status: <None>   Medication Order Taking? Sig Documenting Provider Last Dose Status Informant  ALPRAZolam (XANAX) 0.5 MG tablet [id number] Yes TAKE ONE TABLET BY MOUTH THREE TIMES A DAY AS NEEDED FOR ANXIETY  Patient taking differently: Take 0.25 mg by mouth 3 (three) times daily as needed.   Plotnikov, Evie Lacks, MD Taking Active   APPLE CIDER VINEGAR PO [id number] Yes Take 2 tablets by mouth daily. [provider] Taking Active   Cholecalciferol (VITAMIN D3) 50 MCG (2000 UT) CHEW [id number] Yes Chew 1 tablet by mouth daily. [provider] Taking Active   diphenhydrAMINE (BENADRYL) 12.5 MG/5ML liquid [id number] Yes Take 12.5 mg by  mouth daily as needed. [provider] Taking Active   ELDERBERRY PO [id number] Yes Take 100 mg by mouth daily. [provider] Taking Active   FIBER PO [id number] Yes Take 5 g by mouth daily. [provider] Taking Active   furosemide (LASIX) 20 MG tablet [id number] Yes Take 20 mg by mouth daily as needed for edema or fluid. [provider] Taking Active   loratadine (CLARITIN) 5 MG/5ML syrup [id number] Yes Take 5 mg by mouth daily as needed for allergies or rhinitis. [provider] Taking Active   metoprolol tartrate (LOPRESSOR) 25 MG tablet [id number] Yes TAKE ONE TABLET BY MOUTH TWICE A DAY Patwardhan, Manish J, MD Taking Active   Multiple Vitamins-Minerals (WOMENS MULTIVITAMIN PO) [id number] Yes Take 2 tablets by mouth daily. Infusion vitamins [provider] Taking Active   multivitamin (POLY-VITAMINS) CHEW [id number] Yes Chew 1 tablet by mouth daily. [provider] Taking Active Self  vitamin C (ASCORBIC ACID) 250 MG tablet [id number] Yes Take 250 mg by mouth daily. [provider] Taking Active   Zinc 30 MG CAPS [id number] Yes Take 1 capsule by mouth daily. [provider] Taking Active             Patient Active Problem List   Diagnosis Date Noted   Mixed hyperlipidemia 04/03/2020   Decreased GFR 07/05/2019   Contact dermatitis 06/28/2018   Anxiety 12/28/2017   Dyslipidemia 12/25/2016   Osteoporosis 06/23/2016   Well adult exam 12/24/2015   Edema 04/27/2014   Fatigue 01/14/2014   PAF (paroxysmal atrial fibrillation) (HCC) 12/09/2013   Seasonal and perennial allergic rhinitis 07/05/2008   Atrophic vaginitis    Situational stress 07/14/2007   Snoring without sleep apnea 04/15/2007     There is no immunization history on file for this patient.  Conditions to be addressed/monitored:  Hyperlipidemia, Atrial Fibrillation, Anxiety,  Osteoporosis, and Allergic Rhinitis  Care Plan : CCM Care Plan   Updates made by Tomasa Blase, RPH since 07/30/2021 12:00 AM     Problem: Afib, HLD, Osteoporosis, Anxiety, Allergic Rhinitis   Priority: High  Onset Date: 04/30/2021     Long-Range Goal: Disease Management   Start Date: 04/30/2021  Expected End Date: 10/31/2021  This Visit's Progress: Not on track  Recent Progress: On track  Priority: High  Note:   Current Barriers:  Unable to independently monitor therapeutic efficacy Unable to achieve control of LDL   Pharmacist Clinical Goal(s):  Patient will achieve adherence to monitoring guidelines and medication adherence to achieve therapeutic efficacy achieve control of LDL as evidenced by next lipid panel maintain control of anxiety/stress and Afib as evidenced by anxiety and stress levels / HR  through collaboration with PharmD and provider.   Interventions: 1:1 collaboration with Plotnikov, Evie Lacks, MD regarding development and update of comprehensive plan of care as evidenced by provider attestation and co-signature Inter-disciplinary care team collaboration (see longitudinal plan of care) Comprehensive medication review performed; medication list updated in electronic medical record  Hyperlipidemia: (LDL goal < 100) -Not ideally controlled - increased from last lipid panel Lab Results  Component Value Date   [place] 164 (H) 07/16/2021  -Current treatment: N/a -Medications previously tried: fish oils   -Current dietary patterns: reports to a diet that consists mainly of vegetables, eats a good amount of fish, does have other proteins as well, low in foods high in cholesterol -Current exercise habits: walking 10-12k steps daily  -Educated on Cholesterol goals;  Benefits of statin for ASCVD risk reduction; Importance of limiting foods high in cholesterol; Exercise goal of 150 minutes per week; -Discussed with patient about concern for current LDL level, discussed possible statin initiation which she was not interested in  starting, patient agreeable to retrial of red yeast rice, can start at taking 1 capsule every other day and increase as she is able  Atrial Fibrillation (Goal: prevent stroke and major bleeding) -Controlled - sinus rhythm maintained for 4 years (while on flecainide) -CHADSVASC: 2 -Current treatment: Rate control: metoprolol tartrate 79m - 1 tablet twice daily Anticoagulation: n/a - not started due to low CHADSVASc - has taken aspirin in past and was unable to tolerate  Additional Cardiac Medications - Furosemide 260m- 1 tablet daily as needed for edema (uses ~once every 3 months) -Medications previously tried: flecainide, furosemide, aspirin -Home BP and HR readings: HR averaging 68-70 bpm  -Counseled on monitoring of HR, reaching out to office with any issues or concerns regarding HR / palpitations  -Counseled on diet and exercise extensively Recommended to continue current medication  Anxiety (Goal: prevention / control of anxiety attacks) -Controlled -Current treatment: Alprazolam 0.21m97m 1/2 tablet twice daily, at times may use 1/2 tablet in afternoon if needed  -Medications previously tried/failed: n/a -GAD7: 7 -Educated on Benefits of medication for symptom control -Recommended to continue current medication  Osteoporosis Goal prevention of fractures) -Not ideally controlled -Last DEXA Scan: 11/2016   T-Score femoral neck: -2.4  T-Score left femoral neck: -2.5  T-Score lumbar spine: -3.5 -Patient is a candidate for pharmacologic treatment due to T-Score < -2.5 in femoral neck and T-Score < -2.5 in lumbar spine -Current treatment  Vitamin D3 2000units - 1 tablet daily   -Medications previously tried: n/a  -Recommend [phone] units of vitamin D daily. Recommend 1200 mg of calcium daily from dietary and supplemental sources. -Discussed with patient about starting  a bisphosphonate to lower risk of fracture, patient is not interested in starting a new medication at this  time  Allergic Rhinitis (Goal: Control / prevention of allergies) -Controlled -Current treatment  Loratadine 52m/ 549msyrup - 22m83maily if needed  Diphenhydramine 12.22mg58mL - 12.22mg 46mly if needed (does not take if she has already taken loratadine) -Medications previously tried: n/a  -Recommended to continue current medication  Health Maintenance -Vaccine gaps: Tdap, Shingles, pneumonia, influenza -Current therapy:  Multivitamin - 1 tablet daily  Vitamin C 250mg 13mtablet daily  Womens Multivitamin  - 2 tablets (1 serving) daily  Zinc 30mg -53mablet daily  Fiber 5g - 1 tablet daily  Elderberry 100mg - 222mblet daily  Apple Cider Vinegar - 2 tablets daily  -Educated on Herbal supplement research is limited and benefits usually cannot be proven Cost vs benefit of each product must be carefully weighed by individual consumer -Patient is satisfied with current therapy and denies issues -Recommended to continue current medication  Patient Goals/Self-Care Activities Patient will:  - take medications as prescribed target a minimum of 150 minutes of moderate intensity exercise weekly  Follow Up Plan: Telephone follow up appointment with care management team member scheduled for: 3 months  The patient has been provided with contact information for the care management team and has been advised to call with any health related questions or concerns.         Medication Assistance: None required.  Patient affirms current coverage meets needs.  Patient's preferred [facility] is:  [facility] [phone] box? Yes Pt endorses 100% compliance  Care Plan and Follow Up Patient Decision:  Patient agrees to Care Plan and Follow-up.  Plan: Telephone follow up appointment with care management team member scheduled for:  3 months , The  patient has been provided with contact information for the care management team and has been advised to call with any health related questions or concerns.  Soriya Worster CTomasa Blase Clinical Pharmacist, [place] examination/treatment/procedure(s) were performed by non-physician practitioner and as supervising physician I was immediately available for consultation/collaboration.  I agree with above. Aleksei Lew Dawes

## 2021-07-30 NOTE — Patient Instructions (Signed)
Abri JENY NIELD,  It was great to talk to you today!  Please call me with any questions or concerns.  Visit Information  Patient verbalizes understanding of instructions provided today and agrees to view in MyChart.   Telephone follow up appointment with care management team member scheduled for: 3 months The patient has been provided with contact information for the care management team and has been advised to call with any health related questions or concerns.   Ellin Saba, PharmD Clinical Pharmacist, [facility]

## 2021-08-20 DIAGNOSIS — E785 Hyperlipidemia, unspecified: Secondary | ICD-10-CM | POA: Diagnosis not present

## 2021-08-20 DIAGNOSIS — E782 Mixed hyperlipidemia: Secondary | ICD-10-CM

## 2021-10-30 ENCOUNTER — Ambulatory Visit (INDEPENDENT_AMBULATORY_CARE_PROVIDER_SITE_OTHER): Payer: Medicare HMO

## 2021-10-30 DIAGNOSIS — E785 Hyperlipidemia, unspecified: Secondary | ICD-10-CM

## 2021-10-30 DIAGNOSIS — I480 Paroxysmal atrial fibrillation: Secondary | ICD-10-CM

## 2021-10-30 DIAGNOSIS — F419 Anxiety disorder, unspecified: Secondary | ICD-10-CM

## 2021-10-30 NOTE — Patient Instructions (Signed)
Visit Information  Following are the goals we discussed today:   Track and Monitor My Heart Rate and Rhythm   Timeframe:  Long-Range Goal Priority:  High Start Date:   04/30/2021                          Expected End Date: 10/31/2022                      Follow Up Date 04/29/2022   - check pulse (heart) rate once a weekly - make a plan to exercise regularly - make a plan to eat healthy - keep all lab appointments - take medicine as prescribed    Why is this important?   Atrial fibrillation may have no symptoms. Sometimes the symptoms get worse or happen more often.  It is important to keep track of what your symptoms are and when they happen.  A change in symptoms is important to discuss with your doctor or nurse.  Being active and healthy eating will also help you manage your heart condition.   Plan: Telephone follow up appointment with care management team member scheduled for:  6 months  The patient has been provided with contact information for the care management team and has been advised to call with any health related questions or concerns.   Tomasa Blase, PharmD Clinical Pharmacist, [facility]   Please call the care guide team at [phone] if you need to cancel or reschedule your appointment.   Patient verbalizes understanding of instructions and care plan provided today and agrees to view in [place]. Active MyChart status confirmed with patient.

## 2021-10-30 NOTE — Progress Notes (Addendum)
Chronic Care Management Pharmacy Note  10/30/2021 Name:  Amber Shelton MRN:  [id number] DOB:  03/11/1951  Summary: -Patient reports to taking red yeast rice x 5-6 days - caused indigestion / chest discomfort - subsided after stopping, no issues since stopping  -Continues to be active every day walking 10-14k steps  -Reports that she has BP monitor, has not checked BP recently -Reports adherence to all of her prescribed medications, no issues with current medications   Recommendations/Changes made from today's visit: - Recommending no changes to medications, patient to start checking BP once weekly, to discuss possible initiation of statin therapy with next PCP appointment  -F/u in 6 months    Subjective: Amber Shelton is an 71 y.o. year old female who is a primary patient of Plotnikov, Evie Lacks, MD.  The CCM team was consulted for assistance with disease management and care coordination needs.    Engaged with patient by telephone for follow up visit in response to provider referral for pharmacy case management and/or care coordination services.   Consent to Services:  The patient was given the following information about Chronic Care Management services today, agreed to services, and gave verbal consent: 1. CCM service includes personalized support from designated clinical staff supervised by the primary care provider, including individualized plan of care and coordination with other care providers 2. 24/7 contact phone numbers for assistance for urgent and routine care needs. 3. Service will only be billed when office clinical staff spend 20 minutes or more in a month to coordinate care. 4. Only one practitioner may furnish and bill the service in a calendar month. 5.The patient may stop CCM services at any time (effective at the end of the month) by phone call to the office staff. 6. The patient will be responsible for cost sharing (co-pay) of up to 20% of the service fee (after annual  deductible is met). Patient agreed to services and consent obtained.  Patient Care Team: Plotnikov, Evie Lacks, MD as PCP - General (Internal Medicine) Adrian Prows, MD as Consulting Physician (Cardiology) Delice Bison Darnelle Maffucci, [facility] as Pharmacist (Pharmacist)  Recent office visits: 07/16/2021 - Dr. Alain Marion - 6 month f/u - patient declined statins, agreeable to trial of red yeast rice   Recent consult visits: None since last visit   Hospital visits: None in previous 6 months  Objective:  Lab Results  Component Value Date   CREATININE 1.04 07/16/2021   BUN 21 07/16/2021   GFR 54.88 (L) 07/16/2021   GFRNONAA 74 (L) 12/10/2013   GFRAA 85 (L) 12/10/2013   NA 137 07/16/2021   K 4.9 07/16/2021   CALCIUM 9.9 07/16/2021   CO2 32 07/16/2021   GLUCOSE 98 07/16/2021    Lab Results  Component Value Date/Time   HGBA1C 5.4 12/10/2013 04:08 AM   GFR 54.88 (L) 07/16/2021 10:31 AM   GFR 66.62 07/15/2020 09:43 AM    Last diabetic Eye exam:  No results found for: HMDIABEYEEXA  Last diabetic Foot exam:  No results found for: HMDIABFOOTEX   Lab Results  Component Value Date   CHOL 252 (H) 07/16/2021   HDL 52.40 07/16/2021   LDLCALC 164 (H) 07/16/2021   LDLDIRECT 149.0 07/15/2020   TRIG 178.0 (H) 07/16/2021   CHOLHDL 5 07/16/2021    Hepatic Function Latest Ref Rng & Units 07/16/2021 07/15/2020 07/05/2019  Total Protein 6.0 - 8.3 g/dL 7.3 6.8 7.2  Albumin 3.5 - 5.2 g/dL 4.4 4.2 4.3  AST 0 - 37 U/L  25 22 26   ALT 0 - 35 U/L 24 16 26   Alk Phosphatase 39 - 117 U/L 77 71 81  Total Bilirubin 0.2 - 1.2 mg/dL 1.4(H) 1.2 1.2  Bilirubin, Direct 0.0 - 0.3 mg/dL - - 0.2    Lab Results  Component Value Date/Time   TSH 2.31 07/16/2021 10:31 AM   TSH 3.03 07/15/2020 09:43 AM   FREET4 0.92 07/05/2019 10:18 AM   FREET4 0.86 04/27/2014 11:17 AM    CBC Latest Ref Rng & Units 07/16/2021 07/15/2020 07/05/2019  WBC 4.0 - 10.5 K/uL 8.6 8.2 9.7  Hemoglobin 12.0 - 15.0 g/dL 14.5 14.7 14.5   Hematocrit 36.0 - 46.0 % 43.2 43.4 42.2  Platelets 150.0 - 400.0 K/uL 246.0 246.0 241.0    Lab Results  Component Value Date/Time   VD25OH 83.57 07/05/2019 10:18 AM    Clinical ASCVD: No  The 10-year ASCVD risk score (Arnett DK, et al., 2019) is: 9.6%   Values used to calculate the score:     Age: 34 years     Sex: Female     Is Non-Hispanic African American: No     Diabetic: No     Tobacco smoker: No     Systolic Blood Pressure: 176 mmHg     Is BP treated: No     HDL Cholesterol: 52.4 mg/dL     Total Cholesterol: 252 mg/dL    Depression screen [facility] 2/9 07/16/2021 07/15/2020 07/05/2019  Decreased Interest 0 0 1  Down, Depressed, Hopeless 0 0 1  PHQ - 2 Score 0 0 2  Altered sleeping 0 - 0  Tired, decreased energy 0 - 0  Change in appetite 0 - 0  Feeling bad or failure about yourself  0 - 0  Trouble concentrating 0 - 0  Moving slowly or fidgety/restless 0 - 0  Suicidal thoughts 0 - 0  PHQ-9 Score 0 - 2  Difficult doing work/chores - - Not difficult at all    Social History   Tobacco Use  Smoking Status Never  Smokeless Tobacco Never   BP Readings from Last 3 Encounters:  07/16/21 130/72  01/14/21 120/72  10/16/20 115/68   Pulse Readings from Last 3 Encounters:  07/16/21 (!) 59  01/14/21 (!) 57  10/16/20 60   Wt Readings from Last 3 Encounters:  07/16/21 132 lb 12.8 oz (60.2 kg)  01/14/21 133 lb (60.3 kg)  10/16/20 134 lb (60.8 kg)   BMI Readings from Last 3 Encounters:  07/16/21 25.09 kg/m  01/14/21 25.13 kg/m  10/16/20 25.32 kg/m    Assessment/Interventions: Review of patient past medical history, allergies, medications, health status, including review of consultants reports, laboratory and other test data, was performed as part of comprehensive evaluation and provision of chronic care management services.   SDOH:  (Social Determinants of Health) assessments and interventions performed: Yes  SDOH Screenings   Alcohol Screen: Not on file   Depression (PHQ2-9): Low Risk    PHQ-2 Score: 0  Financial Resource Strain: Low Risk    Difficulty of Paying Living Expenses: Not very hard  Food Insecurity: Not on file  Housing: Not on file  Physical Activity: Not on file  Social Connections: Not on file  Stress: Not on file  Tobacco Use: Low Risk    Smoking Tobacco Use: Never   Smokeless Tobacco Use: Never   Passive Exposure: Not on file  Transportation Needs: Not on file    [place]  Allergies  Allergen Reactions  Latex Swelling and Other (See Comments)    blisters   Amoxicillin     Per pharmacy   Buspirone Hcl Other (See Comments)    "felt like half body missing"   Contrast Media [Iodinated Contrast Media]    Fluoxetine Hcl Other (See Comments)    Not sure reaction per pt   Meperidine Hcl     REACTION: hypertension   Morphine Nausea And Vomiting   Nortriptyline Hcl Other (See Comments)    nightmares   Orange Fruit [Citrus]     Burning of mouth   Peanuts [Peanut Oil]    Red Yeast Rice Extract     Indigestion / chest discomfort     Medications Reviewed Today     Reviewed by Tomasa Blase, [facility] (Pharmacist) on 10/30/21 at 1548  Med List Status: <None>   Medication Order Taking? Sig Documenting Provider Last Dose Status Informant  ALPRAZolam (XANAX) 0.5 MG tablet [id number] Yes TAKE ONE TABLET BY MOUTH THREE TIMES A DAY AS NEEDED FOR ANXIETY Plotnikov, Evie Lacks, MD Taking Active   APPLE CIDER VINEGAR PO [id number] Yes Take 2 tablets by mouth daily. [provider] Taking Active   Cholecalciferol (VITAMIN D3) 50 MCG (2000 UT) CHEW [id number] Yes Chew 1 tablet by mouth daily. [provider] Taking Active   diphenhydrAMINE (BENADRYL) 12.5 MG/5ML liquid [id number] Yes Take 12.5 mg by mouth daily as needed. [provider] Taking Active   ELDERBERRY PO [id number] Yes Take 100 mg by mouth daily. [provider] Taking Active   FIBER PO [id number] Yes Take 5 g by mouth daily.  [provider] Taking Active   furosemide (LASIX) 20 MG tablet [id number] Yes Take 20 mg by mouth daily as needed for edema or fluid. [provider] Taking Active   loratadine (CLARITIN) 5 MG/5ML syrup [id number] Yes Take 5 mg by mouth daily as needed for allergies or rhinitis. [provider] Taking Active   metoprolol tartrate (LOPRESSOR) 25 MG tablet [id number] Yes TAKE ONE TABLET BY MOUTH TWICE A DAY Patwardhan, Manish J, MD Taking Active   Multiple Vitamins-Minerals (WOMENS MULTIVITAMIN PO) [id number] Yes Take 2 tablets by mouth daily. Infusion vitamins [provider] Taking Active   multivitamin (POLY-VITAMINS) CHEW [id number] Yes Chew 1 tablet by mouth daily. [provider] Taking Active Self  vitamin C (ASCORBIC ACID) 250 MG tablet [id number] Yes Take 250 mg by mouth daily. [provider] Taking Active   Zinc 30 MG CAPS [id number] Yes Take 1 capsule by mouth daily. [provider] Taking Active             Patient Active Problem List   Diagnosis Date Noted   Mixed hyperlipidemia 04/03/2020   Decreased GFR 07/05/2019   Contact dermatitis 06/28/2018   Anxiety 12/28/2017   Dyslipidemia 12/25/2016   Osteoporosis 06/23/2016   Well adult exam 12/24/2015   Edema 04/27/2014   Fatigue 01/14/2014   PAF (paroxysmal atrial fibrillation) (HCC) 12/09/2013   Seasonal and perennial allergic rhinitis 07/05/2008   Atrophic vaginitis    Situational stress 07/14/2007   Snoring without sleep apnea 04/15/2007     There is no immunization history on file for this patient.  Conditions to be addressed/monitored:  Hyperlipidemia, Atrial Fibrillation, Anxiety, Osteoporosis, and Allergic Rhinitis  Care Plan : CCM Care Plan  Updates made by Tomasa Blase, RPH since 10/30/2021 12:00 AM     Problem: Afib, HLD, Osteoporosis, Anxiety, Allergic Rhinitis   Priority: High  Onset Date: 04/30/2021  Long-Range Goal: Disease Management    Start Date: 04/30/2021  Expected End Date: 10/31/2021  This Visit's Progress: Not on track  Recent Progress: Not on track  Priority: High  Note:   Current Barriers:  Unable to independently monitor therapeutic efficacy Unable to achieve control of LDL   Pharmacist Clinical Goal(s):  Patient will achieve adherence to monitoring guidelines and medication adherence to achieve therapeutic efficacy achieve control of LDL as evidenced by next lipid panel maintain control of anxiety/stress and Afib as evidenced by anxiety and stress levels / HR  through collaboration with PharmD and provider.   Interventions: 1:1 collaboration with Plotnikov, Evie Lacks, MD regarding development and update of comprehensive plan of care as evidenced by provider attestation and co-signature Inter-disciplinary care team collaboration (see longitudinal plan of care) Comprehensive medication review performed; medication list updated in electronic medical record  Hyperlipidemia: (LDL goal < 100) -Not ideally controlled - increased from last lipid panel Lab Results  Component Value Date   [place] 164 (H) 07/16/2021  -Current treatment: N/a -Medications previously tried: fish oils   -Current dietary patterns: reports to a diet that consists mainly of vegetables, eats a good amount of fish, does have other proteins as well, low in foods high in cholesterol -Current exercise habits: walking 10-12k steps daily  -Educated on Cholesterol goals;  Benefits of statin for ASCVD risk reduction; Importance of limiting foods high in cholesterol; Exercise goal of 150 minutes per week; -Discussed with patient about concern for current LDL level, patient had trialed red yeast rice, unable to tolerate (took for 5-6 days) - recommend discussing possible statin therapy with next PCP appointment   Atrial Fibrillation (Goal: prevent stroke and major bleeding) -Controlled - sinus rhythm maintained for 4 years (while on  flecainide) -CHADSVASC: 2 -Current treatment: Rate control: metoprolol tartrate 34m - 1 tablet twice daily Anticoagulation: n/a - not started due to low CHADSVASc - has taken aspirin in past and was unable to tolerate  Additional Cardiac Medications - Furosemide 260m- 1 tablet daily as needed for edema (uses ~once every 3 months) -Medications previously tried: flecainide, furosemide, aspirin -Home BP and HR readings: n/a - has not been checking BP/ HR recently  -Counseled on monitoring of HR, reaching out to office with any issues or concerns regarding HR / palpitations  -Counseled on diet and exercise extensively Recommended to continue current medication  Anxiety (Goal: prevention / control of anxiety attacks) -Controlled -Current treatment: Alprazolam 0.60m960m 1/2 tablet twice daily, at times may use 1/2 tablet in afternoon if needed  -Medications previously tried/failed: n/a -GAD7: 7 -Educated on Benefits of medication for symptom control -Recommended to continue current medication  Osteoporosis Goal prevention of fractures) -Not ideally controlled -Last DEXA Scan: 11/2016   T-Score femoral neck: -2.4  T-Score left femoral neck: -2.5  T-Score lumbar spine: -3.5 -Patient is a candidate for pharmacologic treatment due to T-Score < -2.5 in femoral neck and T-Score < -2.5 in lumbar spine -Current treatment  Vitamin D3 2000units - 1 tablet daily   -Medications previously tried: n/a  -Recommend [phone] units of vitamin D daily. Recommend 1200 mg of calcium daily from dietary and supplemental sources. -Patient previously declined bisphosphonate therapy   Allergic Rhinitis (Goal: Control / prevention of allergies) -Controlled -Current treatment  Loratadine 60mg84mmL 19mup - 60mg d49my if needed  Diphenhydramine 12.60mg/60m53m 12.60mg dai21mif needed (does not take if she has already taken loratadine) -Medications previously tried: n/a  -Recommended to continue current  medication  Health Maintenance -Vaccine gaps: Tdap, Shingles, pneumonia -Current therapy:  Multivitamin - 1 tablet daily  Vitamin C 270m - 1 tablet daily  Womens Multivitamin  - 2 tablets (1 serving) daily  Zinc 339m- 1 tablet daily  Fiber 5g - 1 tablet daily  Elderberry 1006m 1 tablet daily  Apple Cider Vinegar - 2 tablets daily  -Educated on Herbal supplement research is limited and benefits usually cannot be proven Cost vs benefit of each product must be carefully weighed by individual consumer -Patient is satisfied with current therapy and denies issues -Recommended to continue current medication  Patient Goals/Self-Care Activities Patient will:  - take medications as prescribed target a minimum of 150 minutes of moderate intensity exercise weekly  Follow Up Plan: Telephone follow up appointment with care management team member scheduled for: 6 months  The patient has been provided with contact information for the care management team and has been advised to call with any health related questions or concerns.          Medication Assistance: None required.  Patient affirms current coverage meets needs.  [id number] [address] [id number]: [phone]: [phone] Uses pill box? Yes Pt endorses 100% compliance  Care Plan and Follow Up Patient Decision:  Patient agrees to Care Plan and Follow-up.  Plan: Telephone follow up appointment with care management team member scheduled for:  3 months , The patient has been provided with contact information for the care management team and has been advised to call with any health related questions or concerns.  DanTomasa BlaseharmD Clinical Pharmacist, [place] examination/treatment/procedure(s) were performed by non-physician practitioner and as  supervising physician I was immediately available for consultation/collaboration.  I agree with above. AleLew DawesD

## 2021-11-18 DIAGNOSIS — I4891 Unspecified atrial fibrillation: Secondary | ICD-10-CM | POA: Diagnosis not present

## 2021-11-18 DIAGNOSIS — E785 Hyperlipidemia, unspecified: Secondary | ICD-10-CM

## 2022-01-15 ENCOUNTER — Encounter: Payer: Self-pay | Admitting: Internal Medicine

## 2022-01-15 ENCOUNTER — Ambulatory Visit (INDEPENDENT_AMBULATORY_CARE_PROVIDER_SITE_OTHER): Payer: Medicare HMO | Admitting: Internal Medicine

## 2022-01-15 VITALS — BP 120/62 | HR 55 | Temp 98.5°F | Ht 61.0 in | Wt 136.0 lb

## 2022-01-15 DIAGNOSIS — F419 Anxiety disorder, unspecified: Secondary | ICD-10-CM | POA: Diagnosis not present

## 2022-01-15 DIAGNOSIS — Z0000 Encounter for general adult medical examination without abnormal findings: Secondary | ICD-10-CM

## 2022-01-15 DIAGNOSIS — I480 Paroxysmal atrial fibrillation: Secondary | ICD-10-CM | POA: Diagnosis not present

## 2022-01-15 DIAGNOSIS — R609 Edema, unspecified: Secondary | ICD-10-CM | POA: Diagnosis not present

## 2022-01-15 DIAGNOSIS — R944 Abnormal results of kidney function studies: Secondary | ICD-10-CM

## 2022-01-15 DIAGNOSIS — F439 Reaction to severe stress, unspecified: Secondary | ICD-10-CM | POA: Diagnosis not present

## 2022-01-15 DIAGNOSIS — R69 Illness, unspecified: Secondary | ICD-10-CM | POA: Diagnosis not present

## 2022-01-15 MED ORDER — ALPRAZOLAM 0.5 MG PO TABS: 0.5000 mg | ORAL_TABLET | Freq: Three times a day (TID) | ORAL | 1 refills | Status: DC | PRN

## 2022-01-15 MED ORDER — METOPROLOL TARTRATE 25 MG PO TABS: 25.0000 mg | ORAL_TABLET | Freq: Two times a day (BID) | ORAL | 1 refills | Status: DC

## 2022-01-15 MED ORDER — FUROSEMIDE 20 MG PO TABS: 20.0000 mg | ORAL_TABLET | Freq: Every day | ORAL | 0 refills | Status: DC | PRN

## 2022-01-15 NOTE — Assessment & Plan Note (Signed)
Xanax prn  Potential benefits of a long term benzodiazepines  use as well as potential risks  and complications were explained to the patient and were aknowledged. 

## 2022-01-15 NOTE — Assessment & Plan Note (Signed)
Monitor GFR 

## 2022-01-15 NOTE — Progress Notes (Signed)
? ?Subjective:  ?Patient ID: Amber Shelton, female    DOB: 1951-01-14  Age: 71 y.o. MRN: [id number] ? ?CC: Follow-up (6 MONTH F/U- Req refills on lasix, metoprolol, and xanax) ? ? ?HPI ?Amber Shelton presents for anxiety, edema, PAF ? ?Outpatient Medications Prior to Visit  ?Medication Sig Dispense Refill  ? APPLE CIDER VINEGAR PO Take 2 tablets by mouth daily.    ? Cholecalciferol (VITAMIN D3) 50 MCG (2000 UT) CHEW Chew 1 tablet by mouth daily.    ? diphenhydrAMINE (BENADRYL) 12.5 MG/5ML liquid Take 12.5 mg by mouth daily as needed.    ? ELDERBERRY PO Take 100 mg by mouth daily.    ? FIBER PO Take 5 g by mouth daily.    ? loratadine (CLARITIN) 5 MG/5ML syrup Take 5 mg by mouth daily as needed for allergies or rhinitis.    ? Multiple Vitamins-Minerals (WOMENS MULTIVITAMIN PO) Take 2 tablets by mouth daily. Infusion vitamins    ? vitamin C (ASCORBIC ACID) 250 MG tablet Take 250 mg by mouth daily.    ? Zinc 30 MG CAPS Take 1 capsule by mouth daily.    ? ALPRAZolam (XANAX) 0.5 MG tablet TAKE ONE TABLET BY MOUTH THREE TIMES A DAY AS NEEDED FOR ANXIETY 90 tablet 3  ? furosemide (LASIX) 20 MG tablet Take 20 mg by mouth daily as needed for edema or fluid.    ? metoprolol tartrate (LOPRESSOR) 25 MG tablet TAKE ONE TABLET BY MOUTH TWICE A DAY 180 tablet 3  ? multivitamin (POLY-VITAMINS) CHEW Chew 1 tablet by mouth daily.    ? ?No facility-administered medications prior to visit.  ? ? ?ROS: ?Review of Systems  ?Constitutional:  Negative for activity change, appetite change, chills, fatigue and unexpected weight change.  ?HENT:  Negative for congestion, mouth sores and sinus pressure.   ?Eyes:  Negative for visual disturbance.  ?Respiratory:  Negative for cough and chest tightness.   ?Gastrointestinal:  Negative for abdominal pain and nausea.  ?Genitourinary:  Negative for difficulty urinating, frequency and vaginal pain.  ?Musculoskeletal:  Negative for back pain and gait problem.  ?Skin:  Negative for pallor and rash.   ?Neurological:  Negative for dizziness, tremors, weakness, numbness and headaches.  ?Psychiatric/Behavioral:  Negative for confusion, sleep disturbance and suicidal ideas. The patient is nervous/anxious.   ? ?Objective:  ?BP 120/62 (BP Location: Left Arm)   Pulse (!) 55   Temp 98.5 ?F (36.9 ?C) (Oral)   Ht 5\' 1"  (1.549 m)   Wt 136 lb (61.7 kg)   SpO2 98%   BMI 25.70 kg/m?  ? ?BP Readings from Last 3 Encounters:  ?01/15/22 120/62  ?07/16/21 130/72  ?01/14/21 120/72  ? ? ?Wt Readings from Last 3 Encounters:  ?01/15/22 136 lb (61.7 kg)  ?07/16/21 132 lb 12.8 oz (60.2 kg)  ?01/14/21 133 lb (60.3 kg)  ? ? ?Physical Exam ?Constitutional:   ?   General: She is not in acute distress. ?   Appearance: She is well-developed.  ?HENT:  ?   Head: Normocephalic.  ?   Right Ear: External ear normal.  ?   Left Ear: External ear normal.  ?   Nose: Nose normal.  ?Eyes:  ?   General:     ?   Right eye: No discharge.     ?   Left eye: No discharge.  ?   Conjunctiva/sclera: Conjunctivae normal.  ?   Pupils: Pupils are equal, round, and reactive to light.  ?Neck:  ?  Thyroid: No thyromegaly.  ?   Vascular: No JVD.  ?   Trachea: No tracheal deviation.  ?Cardiovascular:  ?   Rate and Rhythm: Normal rate and regular rhythm.  ?   Heart sounds: Normal heart sounds.  ?Pulmonary:  ?   Effort: No respiratory distress.  ?   Breath sounds: No stridor. No wheezing.  ?Abdominal:  ?   General: Bowel sounds are normal. There is no distension.  ?   Palpations: Abdomen is soft. There is no mass.  ?   Tenderness: There is no abdominal tenderness. There is no guarding or rebound.  ?Musculoskeletal:     ?   General: No tenderness.  ?   Cervical back: Normal range of motion and neck supple. No rigidity.  ?Lymphadenopathy:  ?   Cervical: No cervical adenopathy.  ?Skin: ?   Findings: No erythema or rash.  ?Neurological:  ?   Cranial Nerves: No cranial nerve deficit.  ?   Motor: No abnormal muscle tone.  ?   Coordination: Coordination normal.  ?   Deep  Tendon Reflexes: Reflexes normal.  ?Psychiatric:     ?   Behavior: Behavior normal.     ?   Thought Content: Thought content normal.     ?   Judgment: Judgment normal.  ? ? ?Lab Results  ?Component Value Date  ? WBC 8.6 07/16/2021  ? HGB 14.5 07/16/2021  ? HCT 43.2 07/16/2021  ? PLT 246.0 07/16/2021  ? GLUCOSE 98 07/16/2021  ? CHOL 252 (H) 07/16/2021  ? TRIG 178.0 (H) 07/16/2021  ? HDL 52.40 07/16/2021  ? LDLDIRECT 149.0 07/15/2020  ? LDLCALC 164 (H) 07/16/2021  ? ALT 24 07/16/2021  ? AST 25 07/16/2021  ? NA 137 07/16/2021  ? K 4.9 07/16/2021  ? CL 99 07/16/2021  ? CREATININE 1.04 07/16/2021  ? BUN 21 07/16/2021  ? CO2 32 07/16/2021  ? TSH 2.31 07/16/2021  ? HGBA1C 5.4 12/10/2013  ? ? ?US Renal ? ?Result Date: 07/26/2019 ?CLINICAL DATA:  Initial evaluation for decreased GFR. EXAM: RENAL / URINARY TRACT ULTRASOUND COMPLETE COMPARISON:  Prior ultrasound from 06/12/2014. FINDINGS: Right Kidney: Renal measurements: 9.9 x 3.2 x 3.4 cm = volume: 57.0 mL. Increased echogenicity within the renal parenchyma, suggesting medical renal disease. No nephrolithiasis or hydronephrosis. 1.0 x 0.9 x 1.1 cm minimally complex cyst present within the interpolar region, similar relative to 2015, most likely benign. Left Kidney: Renal measurements: 11.1 x 3.8 x 4.4 cm = volume: 96.6 mL. Increased echogenicity within the renal parenchyma, compatible with medical renal disease. No nephrolithiasis or hydronephrosis. No focal renal mass. Bladder: Appears normal for degree of bladder distention. Bilateral ureteral jets are visualized. Other: None. IMPRESSION: 1. Increased echogenicity within the renal parenchyma, compatible with medical renal disease. 2. No hydronephrosis. 3. 1.1 cm cyst within the interpolar right kidney, grossly stable from 2015, and most likely benign. Electronically Signed   By: Rise Mu M.D.   On: 07/26/2019 21:52  ? ? ? ?Assessment & Plan:  ? ?Problem List Items Addressed This Visit   ? ? Well adult exam -  Primary  ? Relevant Orders  ? TSH  ? Urinalysis  ? CBC with Differential/Platelet  ? Lipid panel  ? Comprehensive metabolic panel  ? Situational stress  ?  Cont on Xanax prn ? Potential benefits of a long term benzodiazepines  use as well as potential risks  and complications were explained to the patient and were aknowledged. ?  ?  ?  Relevant Orders  ? TSH  ? Urinalysis  ? CBC with Differential/Platelet  ? Lipid panel  ? Comprehensive metabolic panel  ? Edema  ?  Lasix prn rare ?  ?  ? Anxiety  ?  Xanax prn ? Potential benefits of a long term benzodiazepines  use as well as potential risks  and complications were explained to the patient and were aknowledged. ? ?  ?  ? Relevant Medications  ? ALPRAZolam (XANAX) 0.5 MG tablet  ? Decreased GFR  ?  Monitor GFR ? ?  ?  ? PAF (paroxysmal atrial fibrillation) (HCC) (Chronic)  ?  On Metoprolol ?  ?  ? Relevant Medications  ? furosemide (LASIX) 20 MG tablet  ? metoprolol tartrate (LOPRESSOR) 25 MG tablet  ? Other Relevant Orders  ? TSH  ? Urinalysis  ? CBC with Differential/Platelet  ? Lipid panel  ? Comprehensive metabolic panel  ?  ? ? ?Meds ordered this encounter  ?Medications  ? furosemide (LASIX) 20 MG tablet  ?  Sig: Take 1 tablet (20 mg total) by mouth daily as needed for edema or fluid.  ?  Dispense:  30 tablet  ?  Refill:  0  ? metoprolol tartrate (LOPRESSOR) 25 MG tablet  ?  Sig: Take 1 tablet (25 mg total) by mouth 2 (two) times daily.  ?  Dispense:  180 tablet  ?  Refill:  1  ? ALPRAZolam (XANAX) 0.5 MG tablet  ?  Sig: Take 1 tablet (0.5 mg total) by mouth 3 (three) times daily as needed. for anxiety  ?  Dispense:  90 tablet  ?  Refill:  1  ?  ? ? ?Follow-up: Return in about 6 months (around 07/17/2022) for Wellness Exam. ? ?Sonda PrimesAlex Preslie Depasquale, MD ?

## 2022-01-15 NOTE — Assessment & Plan Note (Signed)
Lasix prn rare ?

## 2022-01-15 NOTE — Assessment & Plan Note (Signed)
Cont on Xanax prn °Potential benefits of a long term benzodiazepines  use as well as potential risks  and complications were explained to the patient and were aknowledged. °

## 2022-01-15 NOTE — Assessment & Plan Note (Signed)
On Metoprolol 

## 2022-03-19 ENCOUNTER — Encounter: Payer: Self-pay | Admitting: Internal Medicine

## 2022-07-15 ENCOUNTER — Other Ambulatory Visit (INDEPENDENT_AMBULATORY_CARE_PROVIDER_SITE_OTHER): Payer: Medicare HMO

## 2022-07-15 DIAGNOSIS — I480 Paroxysmal atrial fibrillation: Secondary | ICD-10-CM

## 2022-07-15 DIAGNOSIS — R69 Illness, unspecified: Secondary | ICD-10-CM | POA: Diagnosis not present

## 2022-07-15 DIAGNOSIS — F439 Reaction to severe stress, unspecified: Secondary | ICD-10-CM | POA: Diagnosis not present

## 2022-07-15 DIAGNOSIS — Z0000 Encounter for general adult medical examination without abnormal findings: Secondary | ICD-10-CM

## 2022-07-15 LAB — CBC WITH DIFFERENTIAL/PLATELET
Basophils Absolute: 0.1 10*3/uL (ref 0.0–0.1)
Basophils Relative: 0.8 % (ref 0.0–3.0)
Eosinophils Absolute: 0.2 10*3/uL (ref 0.0–0.7)
Eosinophils Relative: 2.2 % (ref 0.0–5.0)
HCT: 43.5 % (ref 36.0–46.0)
Hemoglobin: 14.5 g/dL (ref 12.0–15.0)
Lymphocytes Relative: 42.8 % (ref 12.0–46.0)
Lymphs Abs: 3.5 10*3/uL (ref 0.7–4.0)
MCHC: 33.4 g/dL (ref 30.0–36.0)
MCV: 92.9 fl (ref 78.0–100.0)
Monocytes Absolute: 0.6 10*3/uL (ref 0.1–1.0)
Monocytes Relative: 6.7 % (ref 3.0–12.0)
Neutro Abs: 3.9 10*3/uL (ref 1.4–7.7)
Neutrophils Relative %: 47.5 % (ref 43.0–77.0)
Platelets: 230 10*3/uL (ref 150.0–400.0)
RBC: 4.68 Mil/uL (ref 3.87–5.11)
RDW: 13.3 % (ref 11.5–15.5)
WBC: 8.2 10*3/uL (ref 4.0–10.5)

## 2022-07-15 LAB — LIPID PANEL
Cholesterol: 244 mg/dL — ABNORMAL HIGH (ref 0–200)
HDL: 48.4 mg/dL (ref >39.00)
NonHDL: 195.69
Total CHOL/HDL Ratio: 5
Triglycerides: 266 mg/dL — ABNORMAL HIGH (ref 0.0–149.0)
VLDL: 53.2 mg/dL — ABNORMAL HIGH (ref 0.0–40.0)

## 2022-07-15 LAB — COMPREHENSIVE METABOLIC PANEL WITH GFR
ALT: 32 U/L (ref 0–35)
AST: 29 U/L (ref 0–37)
Albumin: 4.2 g/dL (ref 3.5–5.2)
Alkaline Phosphatase: 83 U/L (ref 39–117)
BUN: 17 mg/dL (ref 6–23)
CO2: 31 meq/L (ref 19–32)
Calcium: 9.5 mg/dL (ref 8.4–10.5)
Chloride: 99 meq/L (ref 96–112)
Creatinine, Ser: 0.85 mg/dL (ref 0.40–1.20)
GFR: 69.42 mL/min (ref >60.00)
Glucose, Bld: 92 mg/dL (ref 70–99)
Potassium: 5 meq/L (ref 3.5–5.1)
Sodium: 135 meq/L (ref 135–145)
Total Bilirubin: 1.1 mg/dL (ref 0.2–1.2)
Total Protein: 7.1 g/dL (ref 6.0–8.3)

## 2022-07-15 LAB — URINALYSIS
Bilirubin Urine: NEGATIVE
Hgb urine dipstick: NEGATIVE
Ketones, ur: NEGATIVE
Leukocytes,Ua: NEGATIVE
Nitrite: NEGATIVE
Specific Gravity, Urine: 1.015 (ref 1.000–1.030)
Total Protein, Urine: NEGATIVE
Urine Glucose: NEGATIVE
Urobilinogen, UA: 0.2 (ref 0.0–1.0)
pH: 7.5 (ref 5.0–8.0)

## 2022-07-15 LAB — TSH: TSH: 3.04 u[IU]/mL (ref 0.35–5.50)

## 2022-07-15 LAB — LDL CHOLESTEROL, DIRECT: Direct LDL: 157 mg/dL

## 2022-07-20 ENCOUNTER — Ambulatory Visit: Payer: Medicare HMO | Admitting: Internal Medicine

## 2022-07-22 ENCOUNTER — Encounter: Payer: Self-pay | Admitting: Internal Medicine

## 2022-07-22 ENCOUNTER — Ambulatory Visit (INDEPENDENT_AMBULATORY_CARE_PROVIDER_SITE_OTHER): Payer: Medicare HMO | Admitting: Internal Medicine

## 2022-07-22 VITALS — BP 112/72 | HR 57 | Temp 98.3°F | Ht 61.0 in | Wt 134.2 lb

## 2022-07-22 DIAGNOSIS — I480 Paroxysmal atrial fibrillation: Secondary | ICD-10-CM

## 2022-07-22 DIAGNOSIS — Z0000 Encounter for general adult medical examination without abnormal findings: Secondary | ICD-10-CM

## 2022-07-22 DIAGNOSIS — E782 Mixed hyperlipidemia: Secondary | ICD-10-CM

## 2022-07-22 DIAGNOSIS — R944 Abnormal results of kidney function studies: Secondary | ICD-10-CM | POA: Diagnosis not present

## 2022-07-22 MED ORDER — METOPROLOL TARTRATE 25 MG PO TABS: 25.0000 mg | ORAL_TABLET | Freq: Two times a day (BID) | ORAL | 3 refills | Status: DC

## 2022-07-22 MED ORDER — ALPRAZOLAM 0.5 MG PO TABS: 0.5000 mg | ORAL_TABLET | Freq: Three times a day (TID) | ORAL | 1 refills | Status: DC | PRN

## 2022-07-22 NOTE — Assessment & Plan Note (Addendum)
Pt declined COVID 19 vaccine Fall risk: low to none  Home safety: good  Cognitive evaluation: intact to orientation, naming, recall and repetition  EOL planning: adv directives, full code/ I agree  I have personally reviewed and have noted  1. The patient's medical, surgical and social history  2. Their use of alcohol, tobacco or illicit drugs  3. Their current medications and supplements  4. The patient's functional ability including ADL's, fall risks, home safety risks and hearing or visual impairment.  5. Diet and physical activities  6. Evidence for depression or mood disorders - anxiety 7. The roster of all physicians providing medical care to patient - is listed in the Snapshot section of the chart and reviewed today.    Today patient counseled on age appropriate routine health concerns for screening and prevention, each reviewed and up to date or declined. Immunizations reviewed and up to date or declined. Labs ordered and reviewed. Risk factors for depression reviewed and negative. Hearing function and visual acuity are intact. ADLs screened and addressed as needed. Functional ability and level of safety reviewed and appropriate. Education, counseling and referrals performed based on assessed risks today. Patient provided with a copy of personalized plan for preventive services.   GYN , mammo - pt will schedule Colon due 2023 - declined Pt declined all vaccines

## 2022-07-22 NOTE — Assessment & Plan Note (Signed)
Pt stopped Red rice yeast

## 2022-07-22 NOTE — Progress Notes (Signed)
Subjective:  Patient ID: Amber Shelton, female    DOB: 07-06-1951  Age: 71 y.o. MRN: [id number]  CC: Annual Exam and Medication Refill (Need rx for alprazolam and metoprolol)   HPI Amber Shelton presents for a well exam  Outpatient Medications Prior to Visit  Medication Sig Dispense Refill   APPLE CIDER VINEGAR PO Take 2 tablets by mouth daily.     Cholecalciferol (VITAMIN D3) 50 MCG (2000 UT) CHEW Chew 1 tablet by mouth daily.     diphenhydrAMINE (BENADRYL) 12.5 MG/5ML liquid Take 12.5 mg by mouth daily as needed.     ELDERBERRY PO Take 100 mg by mouth daily.     FIBER PO Take 5 g by mouth daily.     furosemide (LASIX) 20 MG tablet Take 1 tablet (20 mg total) by mouth daily as needed for edema or fluid. 30 tablet 0   loratadine (CLARITIN) 5 MG/5ML syrup Take 5 mg by mouth daily as needed for allergies or rhinitis.     Multiple Vitamins-Minerals (WOMENS MULTIVITAMIN PO) Take 2 tablets by mouth daily. Infusion vitamins     vitamin C (ASCORBIC ACID) 250 MG tablet Take 250 mg by mouth daily.     Zinc 30 MG CAPS Take 1 capsule by mouth daily.     ALPRAZolam (XANAX) 0.5 MG tablet Take 1 tablet (0.5 mg total) by mouth 3 (three) times daily as needed. for anxiety 90 tablet 1   metoprolol tartrate (LOPRESSOR) 25 MG tablet Take 1 tablet (25 mg total) by mouth 2 (two) times daily. 180 tablet 1   No facility-administered medications prior to visit.    ROS: Review of Systems  Constitutional:  Negative for activity change, appetite change, chills, fatigue and unexpected weight change.  HENT:  Negative for congestion, mouth sores and sinus pressure.   Eyes:  Negative for visual disturbance.  Respiratory:  Negative for cough and chest tightness.   Gastrointestinal:  Negative for abdominal pain and nausea.  Genitourinary:  Negative for difficulty urinating, frequency and vaginal pain.  Musculoskeletal:  Negative for back pain and gait problem.  Skin:  Negative for pallor and rash.   Neurological:  Negative for dizziness, tremors, weakness, numbness and headaches.  Psychiatric/Behavioral:  Negative for confusion and sleep disturbance. The patient is nervous/anxious.     Objective:  BP 112/72 (BP Location: Left Arm)   Pulse (!) 57   Temp 98.3 F (36.8 C) (Oral)   Ht 5\' 1"  (1.549 m)   Wt 134 lb 3.2 oz (60.9 kg)   SpO2 96%   BMI 25.36 kg/m   BP Readings from Last 3 Encounters:  07/22/22 112/72  01/15/22 120/62  07/16/21 130/72    Wt Readings from Last 3 Encounters:  07/22/22 134 lb 3.2 oz (60.9 kg)  01/15/22 136 lb (61.7 kg)  07/16/21 132 lb 12.8 oz (60.2 kg)    Physical Exam Constitutional:      General: She is not in acute distress.    Appearance: Normal appearance. She is well-developed.  HENT:     Head: Normocephalic.     Right Ear: External ear normal.     Left Ear: External ear normal.     Nose: Nose normal.  Eyes:     General:        Right eye: No discharge.        Left eye: No discharge.     Conjunctiva/sclera: Conjunctivae normal.     Pupils: Pupils are equal, round, and reactive to light.  Neck:     Thyroid: No thyromegaly.     Vascular: No JVD.     Trachea: No tracheal deviation.  Cardiovascular:     Rate and Rhythm: Normal rate and regular rhythm.     Heart sounds: Normal heart sounds.  Pulmonary:     Effort: No respiratory distress.     Breath sounds: No stridor. No wheezing.  Abdominal:     General: Bowel sounds are normal. There is no distension.     Palpations: Abdomen is soft. There is no mass.     Tenderness: There is no abdominal tenderness. There is no guarding or rebound.  Musculoskeletal:        General: No tenderness.     Cervical back: Normal range of motion and neck supple. No rigidity.  Lymphadenopathy:     Cervical: No cervical adenopathy.  Skin:    Findings: No erythema or rash.  Neurological:     Cranial Nerves: No cranial nerve deficit.     Motor: No abnormal muscle tone.     Coordination: Coordination  normal.     Deep Tendon Reflexes: Reflexes normal.  Psychiatric:        Behavior: Behavior normal.        Thought Content: Thought content normal.        Judgment: Judgment normal.     Lab Results  Component Value Date   WBC 8.2 07/15/2022   HGB 14.5 07/15/2022   HCT 43.5 07/15/2022   PLT 230.0 07/15/2022   GLUCOSE 92 07/15/2022   CHOL 244 (H) 07/15/2022   TRIG 266.0 (H) 07/15/2022   HDL 48.40 07/15/2022   LDLDIRECT 157.0 07/15/2022   LDLCALC 164 (H) 07/16/2021   ALT 32 07/15/2022   AST 29 07/15/2022   NA 135 07/15/2022   K 5.0 07/15/2022   CL 99 07/15/2022   CREATININE 0.85 07/15/2022   BUN 17 07/15/2022   CO2 31 07/15/2022   TSH 3.04 07/15/2022   HGBA1C 5.4 12/10/2013    US Renal  Result Date: 07/26/2019 CLINICAL DATA:  Initial evaluation for decreased GFR. EXAM: RENAL / URINARY TRACT ULTRASOUND COMPLETE COMPARISON:  Prior ultrasound from 06/12/2014. FINDINGS: Right Kidney: Renal measurements: 9.9 x 3.2 x 3.4 cm = volume: 57.0 mL. Increased echogenicity within the renal parenchyma, suggesting medical renal disease. No nephrolithiasis or hydronephrosis. 1.0 x 0.9 x 1.1 cm minimally complex cyst present within the interpolar region, similar relative to 2015, most likely benign. Left Kidney: Renal measurements: 11.1 x 3.8 x 4.4 cm = volume: 96.6 mL. Increased echogenicity within the renal parenchyma, compatible with medical renal disease. No nephrolithiasis or hydronephrosis. No focal renal mass. Bladder: Appears normal for degree of bladder distention. Bilateral ureteral jets are visualized. Other: None. IMPRESSION: 1. Increased echogenicity within the renal parenchyma, compatible with medical renal disease. 2. No hydronephrosis. 3. 1.1 cm cyst within the interpolar right kidney, grossly stable from 2015, and most likely benign. Electronically Signed   By: Rise Mu M.D.   On: 07/26/2019 21:52    Assessment & Plan:   Problem List Items Addressed This Visit      Decreased GFR    Resolved      Mixed hyperlipidemia    Pt stopped Red rice yeast      Relevant Medications   metoprolol tartrate (LOPRESSOR) 25 MG tablet   PAF (paroxysmal atrial fibrillation) (HCC) (Chronic)    Cont on Metoprolol      Relevant Medications   metoprolol tartrate (LOPRESSOR) 25 MG tablet  Well adult exam - Primary    Pt declined COVID 19 vaccine Fall risk: low to none  Home safety: good  Cognitive evaluation: intact to orientation, naming, recall and repetition  EOL planning: adv directives, full code/ I agree  I have personally reviewed and have noted  1. The patient's medical, surgical and social history  2. Their use of alcohol, tobacco or illicit drugs  3. Their current medications and supplements  4. The patient's functional ability including ADL's, fall risks, home safety risks and hearing or visual impairment.  5. Diet and physical activities  6. Evidence for depression or mood disorders - anxiety 7. The roster of all physicians providing medical care to patient - is listed in the Snapshot section of the chart and reviewed today.    Today patient counseled on age appropriate routine health concerns for screening and prevention, each reviewed and up to date or declined. Immunizations reviewed and up to date or declined. Labs ordered and reviewed. Risk factors for depression reviewed and negative. Hearing function and visual acuity are intact. ADLs screened and addressed as needed. Functional ability and level of safety reviewed and appropriate. Education, counseling and referrals performed based on assessed risks today. Patient provided with a copy of personalized plan for preventive services.   GYN , mammo - pt will schedule Colon due 2023 Not vaccinated for COVID 19 by choice          Meds ordered this encounter  Medications   metoprolol tartrate (LOPRESSOR) 25 MG tablet    Sig: Take 1 tablet (25 mg total) by mouth 2 (two) times daily.     Dispense:  180 tablet    Refill:  3   ALPRAZolam (XANAX) 0.5 MG tablet    Sig: Take 1 tablet (0.5 mg total) by mouth 3 (three) times daily as needed. for anxiety    Dispense:  90 tablet    Refill:  1      Follow-up: Return in about 6 months (around 01/20/2023) for a follow-up visit.  Sonda Primes, MD

## 2022-07-22 NOTE — Assessment & Plan Note (Signed)
Cont on Metoprolol 

## 2022-07-22 NOTE — Assessment & Plan Note (Signed)
Resolved

## 2023-01-27 ENCOUNTER — Ambulatory Visit: Payer: Medicare HMO | Admitting: Internal Medicine

## 2023-02-03 ENCOUNTER — Ambulatory Visit (INDEPENDENT_AMBULATORY_CARE_PROVIDER_SITE_OTHER): Payer: Medicare HMO | Admitting: Internal Medicine

## 2023-02-03 ENCOUNTER — Encounter: Payer: Self-pay | Admitting: Internal Medicine

## 2023-02-03 VITALS — BP 118/78 | HR 71 | Temp 98.3°F | Ht 61.0 in | Wt 126.0 lb

## 2023-02-03 DIAGNOSIS — I480 Paroxysmal atrial fibrillation: Secondary | ICD-10-CM | POA: Diagnosis not present

## 2023-02-03 DIAGNOSIS — R944 Abnormal results of kidney function studies: Secondary | ICD-10-CM

## 2023-02-03 DIAGNOSIS — M818 Other osteoporosis without current pathological fracture: Secondary | ICD-10-CM

## 2023-02-03 DIAGNOSIS — Z0000 Encounter for general adult medical examination without abnormal findings: Secondary | ICD-10-CM

## 2023-02-03 DIAGNOSIS — F439 Reaction to severe stress, unspecified: Secondary | ICD-10-CM

## 2023-02-03 MED ORDER — METOPROLOL TARTRATE 25 MG PO TABS: 25.0000 mg | ORAL_TABLET | Freq: Two times a day (BID) | ORAL | 3 refills | Status: DC

## 2023-02-03 MED ORDER — ALPRAZOLAM 0.5 MG PO TABS: 0.5000 mg | ORAL_TABLET | Freq: Three times a day (TID) | ORAL | 1 refills | Status: DC | PRN

## 2023-02-03 NOTE — Progress Notes (Signed)
Subjective:  Patient ID: Amber Shelton, female    DOB: 1951/07/09  Age: 72 y.o. MRN: [id number]  CC: Follow-up (6 mnth f/u)   HPI Amber Shelton presents for anxiety, HTN, allergies On a diet, exercising   Outpatient Medications Prior to Visit  Medication Sig Dispense Refill   APPLE CIDER VINEGAR PO Take 2 tablets by mouth daily.     Cholecalciferol (VITAMIN D3) 50 MCG (2000 UT) CHEW Chew 1 tablet by mouth daily.     diphenhydrAMINE (BENADRYL) 12.5 MG/5ML liquid Take 12.5 mg by mouth daily as needed.     ELDERBERRY PO Take 100 mg by mouth daily.     FIBER PO Take 5 g by mouth daily.     furosemide (LASIX) 20 MG tablet Take 1 tablet (20 mg total) by mouth daily as needed for edema or fluid. 30 tablet 0   loratadine (CLARITIN) 5 MG/5ML syrup Take 5 mg by mouth daily as needed for allergies or rhinitis.     Multiple Vitamins-Minerals (WOMENS MULTIVITAMIN PO) Take 2 tablets by mouth daily. Infusion vitamins     vitamin C (ASCORBIC ACID) 250 MG tablet Take 250 mg by mouth daily.     Zinc 30 MG CAPS Take 1 capsule by mouth daily.     ALPRAZolam (XANAX) 0.5 MG tablet Take 1 tablet (0.5 mg total) by mouth 3 (three) times daily as needed. for anxiety 90 tablet 1   metoprolol tartrate (LOPRESSOR) 25 MG tablet Take 1 tablet (25 mg total) by mouth 2 (two) times daily. 180 tablet 3   No facility-administered medications prior to visit.    ROS: Review of Systems  Constitutional:  Negative for activity change, appetite change, chills, fatigue and unexpected weight change.  HENT:  Negative for congestion, mouth sores and sinus pressure.   Eyes:  Negative for visual disturbance.  Respiratory:  Negative for cough and chest tightness.   Gastrointestinal:  Negative for abdominal pain and nausea.  Genitourinary:  Negative for difficulty urinating, frequency and vaginal pain.  Musculoskeletal:  Negative for back pain and gait problem.  Skin:  Negative for pallor and rash.  Neurological:  Negative  for dizziness, tremors, weakness, numbness and headaches.  Psychiatric/Behavioral:  Negative for confusion and sleep disturbance.     Objective:  BP 118/78 (BP Location: Left Arm, Patient Position: Sitting, Cuff Size: Large)   Pulse 71   Temp 98.3 F (36.8 C) (Oral)   Ht 5\' 1"  (1.549 m)   Wt 126 lb (57.2 kg)   SpO2 95%   BMI 23.81 kg/m   BP Readings from Last 3 Encounters:  02/03/23 118/78  07/22/22 112/72  01/15/22 120/62    Wt Readings from Last 3 Encounters:  02/03/23 126 lb (57.2 kg)  07/22/22 134 lb 3.2 oz (60.9 kg)  01/15/22 136 lb (61.7 kg)    Physical Exam Constitutional:      General: She is not in acute distress.    Appearance: She is well-developed.  HENT:     Head: Normocephalic.     Right Ear: External ear normal.     Left Ear: External ear normal.     Nose: Nose normal.  Eyes:     General:        Right eye: No discharge.        Left eye: No discharge.     Conjunctiva/sclera: Conjunctivae normal.     Pupils: Pupils are equal, round, and reactive to light.  Neck:     Thyroid:  No thyromegaly.     Vascular: No JVD.     Trachea: No tracheal deviation.  Cardiovascular:     Rate and Rhythm: Normal rate and regular rhythm.     Heart sounds: Normal heart sounds.  Pulmonary:     Effort: No respiratory distress.     Breath sounds: No stridor. No wheezing.  Abdominal:     General: Bowel sounds are normal. There is no distension.     Palpations: Abdomen is soft. There is no mass.     Tenderness: There is no abdominal tenderness. There is no guarding or rebound.  Musculoskeletal:        General: No tenderness.     Cervical back: Normal range of motion and neck supple. No rigidity.  Lymphadenopathy:     Cervical: No cervical adenopathy.  Skin:    Findings: No erythema or rash.  Neurological:     Cranial Nerves: No cranial nerve deficit.     Motor: No abnormal muscle tone.     Coordination: Coordination normal.     Deep Tendon Reflexes: Reflexes  normal.  Psychiatric:        Behavior: Behavior normal.        Thought Content: Thought content normal.        Judgment: Judgment normal.     Lab Results  Component Value Date   WBC 8.2 07/15/2022   HGB 14.5 07/15/2022   HCT 43.5 07/15/2022   PLT 230.0 07/15/2022   GLUCOSE 92 07/15/2022   CHOL 244 (H) 07/15/2022   TRIG 266.0 (H) 07/15/2022   HDL 48.40 07/15/2022   LDLDIRECT 157.0 07/15/2022   LDLCALC 164 (H) 07/16/2021   ALT 32 07/15/2022   AST 29 07/15/2022   NA 135 07/15/2022   K 5.0 07/15/2022   CL 99 07/15/2022   CREATININE 0.85 07/15/2022   BUN 17 07/15/2022   CO2 31 07/15/2022   TSH 3.04 07/15/2022   HGBA1C 5.4 12/10/2013    US Renal  Result Date: 07/26/2019 CLINICAL DATA:  Initial evaluation for decreased GFR. EXAM: RENAL / URINARY TRACT ULTRASOUND COMPLETE COMPARISON:  Prior ultrasound from 06/12/2014. FINDINGS: Right Kidney: Renal measurements: 9.9 x 3.2 x 3.4 cm = volume: 57.0 mL. Increased echogenicity within the renal parenchyma, suggesting medical renal disease. No nephrolithiasis or hydronephrosis. 1.0 x 0.9 x 1.1 cm minimally complex cyst present within the interpolar region, similar relative to 2015, most likely benign. Left Kidney: Renal measurements: 11.1 x 3.8 x 4.4 cm = volume: 96.6 mL. Increased echogenicity within the renal parenchyma, compatible with medical renal disease. No nephrolithiasis or hydronephrosis. No focal renal mass. Bladder: Appears normal for degree of bladder distention. Bilateral ureteral jets are visualized. Other: None. IMPRESSION: 1. Increased echogenicity within the renal parenchyma, compatible with medical renal disease. 2. No hydronephrosis. 3. 1.1 cm cyst within the interpolar right kidney, grossly stable from 2015, and most likely benign. Electronically Signed   By: Rise Mu M.D.   On: 07/26/2019 21:52    Assessment & Plan:   Problem List Items Addressed This Visit     PAF (paroxysmal atrial fibrillation) (HCC) -  Primary (Chronic)    Cont on Metoprolol      Relevant Medications   metoprolol tartrate (LOPRESSOR) 25 MG tablet   Other Relevant Orders   TSH   Urinalysis   CBC with Differential/Platelet   Lipid panel   Comprehensive metabolic panel   Situational stress    Cont on Xanax prn  Potential benefits of a long  term benzodiazepines  use as well as potential risks  and complications were explained to the patient and were aknowledged.      Well adult exam   Relevant Orders   TSH   Urinalysis   CBC with Differential/Platelet   Lipid panel   Comprehensive metabolic panel   Osteoporosis    Chronic On Vit D, exersie      Decreased GFR    Monitor GFR      Relevant Orders   Comprehensive metabolic panel      Meds ordered this encounter  Medications   ALPRAZolam (XANAX) 0.5 MG tablet    Sig: Take 1 tablet (0.5 mg total) by mouth 3 (three) times daily as needed. for anxiety    Dispense:  90 tablet    Refill:  1   metoprolol tartrate (LOPRESSOR) 25 MG tablet    Sig: Take 1 tablet (25 mg total) by mouth 2 (two) times daily.    Dispense:  180 tablet    Refill:  3      Follow-up: Return in about 6 months (around 08/06/2023) for Wellness Exam.  Sonda Primes, MD

## 2023-02-03 NOTE — Assessment & Plan Note (Signed)
Monitor GFR 

## 2023-02-03 NOTE — Assessment & Plan Note (Signed)
Cont on Metoprolol 

## 2023-02-03 NOTE — Assessment & Plan Note (Signed)
Cont on Xanax prn °Potential benefits of a long term benzodiazepines  use as well as potential risks  and complications were explained to the patient and were aknowledged. °

## 2023-02-03 NOTE — Assessment & Plan Note (Signed)
Chronic On Vit D, exersie 

## 2023-07-27 ENCOUNTER — Encounter: Payer: Medicare HMO | Admitting: Internal Medicine

## 2023-07-29 ENCOUNTER — Other Ambulatory Visit: Payer: Medicare HMO

## 2023-07-29 DIAGNOSIS — R944 Abnormal results of kidney function studies: Secondary | ICD-10-CM | POA: Diagnosis not present

## 2023-07-29 DIAGNOSIS — I480 Paroxysmal atrial fibrillation: Secondary | ICD-10-CM

## 2023-07-29 DIAGNOSIS — Z0000 Encounter for general adult medical examination without abnormal findings: Secondary | ICD-10-CM

## 2023-07-29 LAB — CBC WITH DIFFERENTIAL/PLATELET
Basophils Absolute: 0.1 10*3/uL (ref 0.0–0.1)
Basophils Relative: 1.1 % (ref 0.0–3.0)
Eosinophils Absolute: 0.2 10*3/uL (ref 0.0–0.7)
Eosinophils Relative: 2.4 % (ref 0.0–5.0)
HCT: 45.1 % (ref 36.0–46.0)
Hemoglobin: 15 g/dL (ref 12.0–15.0)
Lymphocytes Relative: 47.7 % — ABNORMAL HIGH (ref 12.0–46.0)
Lymphs Abs: 3.5 10*3/uL (ref 0.7–4.0)
MCHC: 33.2 g/dL (ref 30.0–36.0)
MCV: 93.5 fL (ref 78.0–100.0)
Monocytes Absolute: 0.5 10*3/uL (ref 0.1–1.0)
Monocytes Relative: 6.2 % (ref 3.0–12.0)
Neutro Abs: 3.1 10*3/uL (ref 1.4–7.7)
Neutrophils Relative %: 42.6 % — ABNORMAL LOW (ref 43.0–77.0)
Platelets: 253 10*3/uL (ref 150.0–400.0)
RBC: 4.82 Mil/uL (ref 3.87–5.11)
RDW: 13 % (ref 11.5–15.5)
WBC: 7.3 10*3/uL (ref 4.0–10.5)

## 2023-07-29 LAB — URINALYSIS, ROUTINE W REFLEX MICROSCOPIC
Bilirubin Urine: NEGATIVE
Hgb urine dipstick: NEGATIVE
Ketones, ur: NEGATIVE
Nitrite: NEGATIVE
Specific Gravity, Urine: 1.015 (ref 1.000–1.030)
Total Protein, Urine: NEGATIVE
Urine Glucose: NEGATIVE
Urobilinogen, UA: 0.2 (ref 0.0–1.0)
pH: 6.5 (ref 5.0–8.0)

## 2023-07-29 LAB — LIPID PANEL
Cholesterol: 240 mg/dL — ABNORMAL HIGH (ref 0–200)
HDL: 49 mg/dL (ref >39.00)
LDL Cholesterol: 157 mg/dL — ABNORMAL HIGH (ref 0–99)
NonHDL: 190.69
Total CHOL/HDL Ratio: 5
Triglycerides: 170 mg/dL — ABNORMAL HIGH (ref 0.0–149.0)
VLDL: 34 mg/dL (ref 0.0–40.0)

## 2023-07-29 LAB — COMPREHENSIVE METABOLIC PANEL
ALT: 15 U/L (ref 0–35)
AST: 23 U/L (ref 0–37)
Albumin: 4.3 g/dL (ref 3.5–5.2)
Alkaline Phosphatase: 72 U/L (ref 39–117)
BUN: 18 mg/dL (ref 6–23)
CO2: 31 meq/L (ref 19–32)
Calcium: 9.8 mg/dL (ref 8.4–10.5)
Chloride: 100 meq/L (ref 96–112)
Creatinine, Ser: 0.95 mg/dL (ref 0.40–1.20)
GFR: 60.31 mL/min (ref >60.00)
Glucose, Bld: 99 mg/dL (ref 70–99)
Potassium: 5.3 meq/L — ABNORMAL HIGH (ref 3.5–5.1)
Sodium: 138 meq/L (ref 135–145)
Total Bilirubin: 1.2 mg/dL (ref 0.2–1.2)
Total Protein: 7.2 g/dL (ref 6.0–8.3)

## 2023-07-29 LAB — TSH: TSH: 3.21 u[IU]/mL (ref 0.35–5.50)

## 2023-08-03 ENCOUNTER — Encounter: Payer: Self-pay | Admitting: Internal Medicine

## 2023-08-03 ENCOUNTER — Ambulatory Visit: Payer: Medicare HMO | Admitting: Internal Medicine

## 2023-08-03 VITALS — BP 118/74 | HR 50 | Temp 97.8°F | Ht 61.0 in | Wt 123.0 lb

## 2023-08-03 DIAGNOSIS — R944 Abnormal results of kidney function studies: Secondary | ICD-10-CM | POA: Diagnosis not present

## 2023-08-03 DIAGNOSIS — Z0000 Encounter for general adult medical examination without abnormal findings: Secondary | ICD-10-CM | POA: Diagnosis not present

## 2023-08-03 DIAGNOSIS — I480 Paroxysmal atrial fibrillation: Secondary | ICD-10-CM | POA: Diagnosis not present

## 2023-08-03 DIAGNOSIS — F419 Anxiety disorder, unspecified: Secondary | ICD-10-CM | POA: Diagnosis not present

## 2023-08-03 MED ORDER — METOPROLOL TARTRATE 25 MG PO TABS: 25.0000 mg | ORAL_TABLET | Freq: Two times a day (BID) | ORAL | 3 refills | Status: DC

## 2023-08-03 MED ORDER — FUROSEMIDE 20 MG PO TABS: 20.0000 mg | ORAL_TABLET | Freq: Every day | ORAL | 1 refills | Status: DC | PRN

## 2023-08-03 MED ORDER — ALPRAZOLAM 0.5 MG PO TABS: 0.5000 mg | ORAL_TABLET | Freq: Three times a day (TID) | ORAL | 1 refills | Status: DC | PRN

## 2023-08-03 NOTE — Assessment & Plan Note (Addendum)
   Today patient counseled on age appropriate routine health concerns for screening and prevention, each reviewed and up to date or declined. Immunizations reviewed and up to date or declined. Labs ordered and reviewed. Risk factors for depression reviewed and negative. Hearing function and visual acuity are intact. ADLs screened and addressed as needed. Functional ability and level of safety reviewed and appropriate. Education, counseling and referrals performed based on assessed risks today. Patient provided with a copy of personalized plan for preventive services.  Pt declined all vaccines GYN , mammo - pt will schedule Colon due 2023 - declined, cologuard - declined Pt declined all vaccines Pt lost wt on diet from 139 lbs

## 2023-08-03 NOTE — Assessment & Plan Note (Signed)
Xanax prn  Potential benefits of a long term benzodiazepines  use as well as potential risks  and complications were explained to the patient and were aknowledged. 

## 2023-08-03 NOTE — Assessment & Plan Note (Signed)
Monitor GFR 

## 2023-08-03 NOTE — Progress Notes (Signed)
Subjective:  Patient ID: Amber Shelton, female    DOB: 08-15-1951  Age: 72 y.o. MRN: [id number]  CC: Medical Management of Chronic Issues (6 MNTH F/U)   HPI Amber Shelton presents for a well exam Pt lost wt on diet from 139 lbs  Outpatient Medications Prior to Visit  Medication Sig Dispense Refill   APPLE CIDER VINEGAR PO Take 2 tablets by mouth daily.     Cholecalciferol (VITAMIN D3) 50 MCG (2000 UT) CHEW Chew 1 tablet by mouth daily.     diphenhydrAMINE (BENADRYL) 12.5 MG/5ML liquid Take 12.5 mg by mouth daily as needed.     ELDERBERRY PO Take 100 mg by mouth daily.     FIBER PO Take 5 g by mouth daily.     loratadine (CLARITIN) 5 MG/5ML syrup Take 5 mg by mouth daily as needed for allergies or rhinitis.     Multiple Vitamins-Minerals (WOMENS MULTIVITAMIN PO) Take 2 tablets by mouth daily. Infusion vitamins     vitamin C (ASCORBIC ACID) 250 MG tablet Take 250 mg by mouth daily.     Zinc 30 MG CAPS Take 1 capsule by mouth daily.     ALPRAZolam (XANAX) 0.5 MG tablet Take 1 tablet (0.5 mg total) by mouth 3 (three) times daily as needed. for anxiety 90 tablet 1   furosemide (LASIX) 20 MG tablet Take 1 tablet (20 mg total) by mouth daily as needed for edema or fluid. 30 tablet 0   metoprolol tartrate (LOPRESSOR) 25 MG tablet Take 1 tablet (25 mg total) by mouth 2 (two) times daily. 180 tablet 3   No facility-administered medications prior to visit.    ROS: Review of Systems  Constitutional:  Negative for activity change, appetite change, chills, fatigue and unexpected weight change.  HENT:  Negative for congestion, mouth sores and sinus pressure.   Eyes:  Negative for visual disturbance.  Respiratory:  Negative for cough and chest tightness.   Gastrointestinal:  Negative for abdominal pain and nausea.  Genitourinary:  Negative for difficulty urinating, frequency and vaginal pain.  Musculoskeletal:  Negative for back pain and gait problem.  Skin:  Negative for pallor and rash.   Neurological:  Negative for dizziness, tremors, weakness, numbness and headaches.  Psychiatric/Behavioral:  Negative for confusion and sleep disturbance.     Objective:  BP 118/74 (BP Location: Right Arm, Patient Position: Sitting, Cuff Size: Normal)   Pulse (!) 50   Temp 97.8 F (36.6 C) (Oral)   Ht 5\' 1"  (1.549 m)   Wt 123 lb (55.8 kg)   SpO2 97%   BMI 23.24 kg/m   BP Readings from Last 3 Encounters:  08/03/23 118/74  02/03/23 118/78  07/22/22 112/72    Wt Readings from Last 3 Encounters:  08/03/23 123 lb (55.8 kg)  02/03/23 126 lb (57.2 kg)  07/22/22 134 lb 3.2 oz (60.9 kg)    Physical Exam Constitutional:      General: She is not in acute distress.    Appearance: She is well-developed.  HENT:     Head: Normocephalic.     Right Ear: External ear normal.     Left Ear: External ear normal.     Nose: Nose normal.  Eyes:     General:        Right eye: No discharge.        Left eye: No discharge.     Conjunctiva/sclera: Conjunctivae normal.     Pupils: Pupils are equal, round, and reactive to  light.  Neck:     Thyroid: No thyromegaly.     Vascular: No JVD.     Trachea: No tracheal deviation.  Cardiovascular:     Rate and Rhythm: Normal rate and regular rhythm.     Heart sounds: Normal heart sounds.  Pulmonary:     Effort: No respiratory distress.     Breath sounds: No stridor. No wheezing.  Abdominal:     General: Bowel sounds are normal. There is no distension.     Palpations: Abdomen is soft. There is no mass.     Tenderness: There is no abdominal tenderness. There is no guarding or rebound.  Musculoskeletal:        General: No tenderness.     Cervical back: Normal range of motion and neck supple. No rigidity.  Lymphadenopathy:     Cervical: No cervical adenopathy.  Skin:    Findings: No erythema or rash.  Neurological:     Cranial Nerves: No cranial nerve deficit.     Motor: No abnormal muscle tone.     Coordination: Coordination normal.     Deep  Tendon Reflexes: Reflexes normal.  Psychiatric:        Behavior: Behavior normal.        Thought Content: Thought content normal.        Judgment: Judgment normal.     Lab Results  Component Value Date   WBC 7.3 07/29/2023   HGB 15.0 07/29/2023   HCT 45.1 07/29/2023   PLT 253.0 07/29/2023   GLUCOSE 99 07/29/2023   CHOL 240 (H) 07/29/2023   TRIG 170.0 (H) 07/29/2023   HDL 49.00 07/29/2023   LDLDIRECT 157.0 07/15/2022   LDLCALC 157 (H) 07/29/2023   ALT 15 07/29/2023   AST 23 07/29/2023   NA 138 07/29/2023   K 5.3 No hemolysis seen (H) 07/29/2023   CL 100 07/29/2023   CREATININE 0.95 07/29/2023   BUN 18 07/29/2023   CO2 31 07/29/2023   TSH 3.21 07/29/2023   HGBA1C 5.4 12/10/2013    US Renal  Result Date: 07/26/2019 CLINICAL DATA:  Initial evaluation for decreased GFR. EXAM: RENAL / URINARY TRACT ULTRASOUND COMPLETE COMPARISON:  Prior ultrasound from 06/12/2014. FINDINGS: Right Kidney: Renal measurements: 9.9 x 3.2 x 3.4 cm = volume: 57.0 mL. Increased echogenicity within the renal parenchyma, suggesting medical renal disease. No nephrolithiasis or hydronephrosis. 1.0 x 0.9 x 1.1 cm minimally complex cyst present within the interpolar region, similar relative to 2015, most likely benign. Left Kidney: Renal measurements: 11.1 x 3.8 x 4.4 cm = volume: 96.6 mL. Increased echogenicity within the renal parenchyma, compatible with medical renal disease. No nephrolithiasis or hydronephrosis. No focal renal mass. Bladder: Appears normal for degree of bladder distention. Bilateral ureteral jets are visualized. Other: None. IMPRESSION: 1. Increased echogenicity within the renal parenchyma, compatible with medical renal disease. 2. No hydronephrosis. 3. 1.1 cm cyst within the interpolar right kidney, grossly stable from 2015, and most likely benign. Electronically Signed   By: Rise Mu M.D.   On: 07/26/2019 21:52    Assessment & Plan:   Problem List Items Addressed This Visit      PAF (paroxysmal atrial fibrillation) (HCC) (Chronic)   Relevant Medications   furosemide (LASIX) 20 MG tablet   metoprolol tartrate (LOPRESSOR) 25 MG tablet   Other Relevant Orders   Basic metabolic panel   Well adult exam - Primary      Today patient counseled on age appropriate routine health concerns for screening and  prevention, each reviewed and up to date or declined. Immunizations reviewed and up to date or declined. Labs ordered and reviewed. Risk factors for depression reviewed and negative. Hearing function and visual acuity are intact. ADLs screened and addressed as needed. Functional ability and level of safety reviewed and appropriate. Education, counseling and referrals performed based on assessed risks today. Patient provided with a copy of personalized plan for preventive services.  Pt declined all vaccines GYN , mammo - pt will schedule Colon due 2023 - declined, cologuard - declined Pt declined all vaccines Pt lost wt on diet from 139 lbs      Anxiety    Xanax prn  Potential benefits of a long term benzodiazepines  use as well as potential risks  and complications were explained to the patient and were aknowledged.      Relevant Medications   ALPRAZolam (XANAX) 0.5 MG tablet   Decreased GFR    Monitor GFR         Meds ordered this encounter  Medications   ALPRAZolam (XANAX) 0.5 MG tablet    Sig: Take 1 tablet (0.5 mg total) by mouth 3 (three) times daily as needed. for anxiety    Dispense:  90 tablet    Refill:  1   furosemide (LASIX) 20 MG tablet    Sig: Take 1 tablet (20 mg total) by mouth daily as needed for edema or fluid.    Dispense:  30 tablet    Refill:  1   metoprolol tartrate (LOPRESSOR) 25 MG tablet    Sig: Take 1 tablet (25 mg total) by mouth 2 (two) times daily.    Dispense:  180 tablet    Refill:  3      Follow-up: Return in about 6 months (around 01/31/2024) for a follow-up visit.  Sonda Primes, MD

## 2024-01-31 ENCOUNTER — Other Ambulatory Visit: Payer: Self-pay | Admitting: Internal Medicine

## 2024-02-08 ENCOUNTER — Ambulatory Visit: Payer: Medicare HMO | Admitting: Internal Medicine

## 2024-02-18 ENCOUNTER — Ambulatory Visit (INDEPENDENT_AMBULATORY_CARE_PROVIDER_SITE_OTHER): Admitting: Internal Medicine

## 2024-02-18 ENCOUNTER — Encounter: Payer: Self-pay | Admitting: Internal Medicine

## 2024-02-18 VITALS — BP 110/68 | HR 55 | Temp 98.1°F | Ht 61.0 in | Wt 122.0 lb

## 2024-02-18 DIAGNOSIS — R944 Abnormal results of kidney function studies: Secondary | ICD-10-CM

## 2024-02-18 DIAGNOSIS — I480 Paroxysmal atrial fibrillation: Secondary | ICD-10-CM | POA: Diagnosis not present

## 2024-02-18 DIAGNOSIS — Z0000 Encounter for general adult medical examination without abnormal findings: Secondary | ICD-10-CM | POA: Diagnosis not present

## 2024-02-18 DIAGNOSIS — E785 Hyperlipidemia, unspecified: Secondary | ICD-10-CM

## 2024-02-18 DIAGNOSIS — F439 Reaction to severe stress, unspecified: Secondary | ICD-10-CM

## 2024-02-18 DIAGNOSIS — F419 Anxiety disorder, unspecified: Secondary | ICD-10-CM | POA: Diagnosis not present

## 2024-02-18 MED ORDER — METOPROLOL TARTRATE 25 MG PO TABS: 25.0000 mg | ORAL_TABLET | Freq: Two times a day (BID) | ORAL | 3 refills | Status: DC

## 2024-02-18 MED ORDER — FUROSEMIDE 20 MG PO TABS: 20.0000 mg | ORAL_TABLET | Freq: Every day | ORAL | 1 refills | Status: AC | PRN

## 2024-02-18 MED ORDER — ALPRAZOLAM 0.5 MG PO TABS: 0.5000 mg | ORAL_TABLET | Freq: Three times a day (TID) | ORAL | 1 refills | Status: DC | PRN

## 2024-02-18 NOTE — Assessment & Plan Note (Signed)
Cont on Xanax prn °Potential benefits of a long term benzodiazepines  use as well as potential risks  and complications were explained to the patient and were aknowledged. °

## 2024-02-18 NOTE — Assessment & Plan Note (Signed)
 Xanax prn  Potential benefits of a long term benzodiazepines  use as well as potential risks  and complications were explained to the patient and were aknowledged.

## 2024-02-18 NOTE — Progress Notes (Signed)
 Subjective:  Patient ID: Amber Shelton, female    DOB: 1951-02-13  Age: 73 y.o. MRN: [id number]  CC: Medical Management of Chronic Issues (6 mnth f/u)   HPI Kamarri W Keleher presents for a f/u - PAF, HTN, anxiety  Outpatient Medications Prior to Visit  Medication Sig Dispense Refill   APPLE CIDER VINEGAR PO Take 2 tablets by mouth daily.     Cholecalciferol (VITAMIN D3) 50 MCG (2000 UT) CHEW Chew 1 tablet by mouth daily.     diphenhydrAMINE  (BENADRYL ) 12.5 MG/5ML liquid Take 12.5 mg by mouth daily as needed.     ELDERBERRY PO Take 100 mg by mouth daily.     FIBER PO Take 5 g by mouth daily.     loratadine  (CLARITIN ) 5 MG/5ML syrup Take 5 mg by mouth daily as needed for allergies or rhinitis.     Multiple Vitamins-Minerals (WOMENS MULTIVITAMIN PO) Take 2 tablets by mouth daily. Infusion vitamins     vitamin C (ASCORBIC ACID) 250 MG tablet Take 250 mg by mouth daily.     Zinc 30 MG CAPS Take 1 capsule by mouth daily.     ALPRAZolam  (XANAX ) 0.5 MG tablet TAKE 1 TABLET BY MOUTH 3 TIMES A DAY AS NEEDED FOR ANXIETY 90 tablet 1   furosemide  (LASIX ) 20 MG tablet Take 1 tablet (20 mg total) by mouth daily as needed for edema or fluid. 30 tablet 1   metoprolol  tartrate (LOPRESSOR ) 25 MG tablet Take 1 tablet (25 mg total) by mouth 2 (two) times daily. 180 tablet 3   No facility-administered medications prior to visit.    ROS: Review of Systems  Constitutional:  Negative for activity change, appetite change, chills, fatigue and unexpected weight change.  HENT:  Negative for congestion, mouth sores and sinus pressure.   Eyes:  Negative for visual disturbance.  Respiratory:  Negative for cough and chest tightness.   Cardiovascular:  Positive for palpitations.  Gastrointestinal:  Negative for abdominal pain and nausea.  Genitourinary:  Negative for difficulty urinating, frequency and vaginal pain.  Musculoskeletal:  Negative for back pain and gait problem.  Skin:  Negative for pallor and rash.   Neurological:  Negative for dizziness, tremors, weakness, numbness and headaches.  Psychiatric/Behavioral:  Negative for confusion, sleep disturbance and suicidal ideas. The patient is nervous/anxious.     Objective:  BP 110/68   Pulse (!) 55   Temp 98.1 F (36.7 C) (Oral)   Ht 5\' 1"  (1.549 m)   Wt 122 lb (55.3 kg)   SpO2 91%   BMI 23.05 kg/m   BP Readings from Last 3 Encounters:  02/18/24 110/68  08/03/23 118/74  02/03/23 118/78    Wt Readings from Last 3 Encounters:  02/18/24 122 lb (55.3 kg)  08/03/23 123 lb (55.8 kg)  02/03/23 126 lb (57.2 kg)    Physical Exam Constitutional:      General: She is not in acute distress.    Appearance: She is well-developed.  HENT:     Head: Normocephalic.     Right Ear: External ear normal.     Left Ear: External ear normal.     Nose: Nose normal.  Eyes:     General:        Right eye: No discharge.        Left eye: No discharge.     Conjunctiva/sclera: Conjunctivae normal.     Pupils: Pupils are equal, round, and reactive to light.  Neck:     Thyroid :  No thyromegaly.     Vascular: No JVD.     Trachea: No tracheal deviation.  Cardiovascular:     Rate and Rhythm: Normal rate and regular rhythm.     Heart sounds: Normal heart sounds.  Pulmonary:     Effort: No respiratory distress.     Breath sounds: No stridor. No wheezing.  Abdominal:     General: Bowel sounds are normal. There is no distension.     Palpations: Abdomen is soft. There is no mass.     Tenderness: There is no abdominal tenderness. There is no guarding or rebound.  Musculoskeletal:        General: No tenderness.     Cervical back: Normal range of motion and neck supple. No rigidity.  Lymphadenopathy:     Cervical: No cervical adenopathy.  Skin:    Findings: No erythema or rash.  Neurological:     Cranial Nerves: No cranial nerve deficit.     Motor: No abnormal muscle tone.     Coordination: Coordination normal.     Deep Tendon Reflexes: Reflexes  normal.  Psychiatric:        Behavior: Behavior normal.        Thought Content: Thought content normal.        Judgment: Judgment normal.     Lab Results  Component Value Date   WBC 7.3 07/29/2023   HGB 15.0 07/29/2023   HCT 45.1 07/29/2023   PLT 253.0 07/29/2023   GLUCOSE 99 07/29/2023   CHOL 240 (H) 07/29/2023   TRIG 170.0 (H) 07/29/2023   HDL 49.00 07/29/2023   LDLDIRECT 157.0 07/15/2022   LDLCALC 157 (H) 07/29/2023   ALT 15 07/29/2023   AST 23 07/29/2023   NA 138 07/29/2023   K 5.3 No hemolysis seen (H) 07/29/2023   CL 100 07/29/2023   CREATININE 0.95 07/29/2023   BUN 18 07/29/2023   CO2 31 07/29/2023   TSH 3.21 07/29/2023   HGBA1C 5.4 12/10/2013    US  Renal Result Date: 07/26/2019 CLINICAL DATA:  Initial evaluation for decreased GFR. EXAM: RENAL / URINARY TRACT ULTRASOUND COMPLETE COMPARISON:  Prior ultrasound from 06/12/2014. FINDINGS: Right Kidney: Renal measurements: 9.9 x 3.2 x 3.4 cm = volume: 57.0 mL. Increased echogenicity within the renal parenchyma, suggesting medical renal disease. No nephrolithiasis or hydronephrosis. 1.0 x 0.9 x 1.1 cm minimally complex cyst present within the interpolar region, similar relative to 2015, most likely benign. Left Kidney: Renal measurements: 11.1 x 3.8 x 4.4 cm = volume: 96.6 mL. Increased echogenicity within the renal parenchyma, compatible with medical renal disease. No nephrolithiasis or hydronephrosis. No focal renal mass. Bladder: Appears normal for degree of bladder distention. Bilateral ureteral jets are visualized. Other: None. IMPRESSION: 1. Increased echogenicity within the renal parenchyma, compatible with medical renal disease. 2. No hydronephrosis. 3. 1.1 cm cyst within the interpolar right kidney, grossly stable from 2015, and most likely benign. Electronically Signed   By: Virgia Griffins M.D.   On: 07/26/2019 21:52    Assessment & Plan:   Problem List Items Addressed This Visit     Situational stress   Cont  on Xanax  prn  Potential benefits of a long term benzodiazepines  use as well as potential risks  and complications were explained to the patient and were aknowledged.      PAF (paroxysmal atrial fibrillation) (HCC) - Primary (Chronic)   Relevant Medications   metoprolol  tartrate (LOPRESSOR ) 25 MG tablet   furosemide  (LASIX ) 20 MG tablet  Other Relevant Orders   TSH   Urinalysis   CBC with Differential/Platelet   Lipid panel   Comprehensive metabolic panel with GFR   Well adult exam   Relevant Orders   Urinalysis   Comprehensive metabolic panel with GFR   Dyslipidemia   Relevant Orders   TSH   Lipid panel   Anxiety   Xanax  prn  Potential benefits of a long term benzodiazepines  use as well as potential risks  and complications were explained to the patient and were aknowledged.      Relevant Medications   ALPRAZolam  (XANAX ) 0.5 MG tablet   Decreased GFR   Monitor GFR  Hydrate yourself well         Meds ordered this encounter  Medications   metoprolol  tartrate (LOPRESSOR ) 25 MG tablet    Sig: Take 1 tablet (25 mg total) by mouth 2 (two) times daily.    Dispense:  180 tablet    Refill:  3   furosemide  (LASIX ) 20 MG tablet    Sig: Take 1 tablet (20 mg total) by mouth daily as needed for edema or fluid.    Dispense:  30 tablet    Refill:  1   ALPRAZolam  (XANAX ) 0.5 MG tablet    Sig: Take 1 tablet (0.5 mg total) by mouth 3 (three) times daily as needed for anxiety.    Dispense:  90 tablet    Refill:  1      Follow-up: Return in about 6 months (around 08/20/2024) for Wellness Exam.  Anitra Barn, MD

## 2024-02-18 NOTE — Assessment & Plan Note (Signed)
 Monitor GFR  Hydrate yourself well

## 2024-04-06 DIAGNOSIS — L255 Unspecified contact dermatitis due to plants, except food: Secondary | ICD-10-CM | POA: Diagnosis not present

## 2024-04-06 DIAGNOSIS — R21 Rash and other nonspecific skin eruption: Secondary | ICD-10-CM | POA: Diagnosis not present

## 2024-08-15 ENCOUNTER — Other Ambulatory Visit

## 2024-08-15 DIAGNOSIS — E785 Hyperlipidemia, unspecified: Secondary | ICD-10-CM

## 2024-08-15 DIAGNOSIS — Z0000 Encounter for general adult medical examination without abnormal findings: Secondary | ICD-10-CM

## 2024-08-15 DIAGNOSIS — I480 Paroxysmal atrial fibrillation: Secondary | ICD-10-CM

## 2024-08-15 LAB — LIPID PANEL
Cholesterol: 236 mg/dL — ABNORMAL HIGH (ref 0–200)
HDL: 53.2 mg/dL (ref >39.00)
LDL Cholesterol: 155 mg/dL — ABNORMAL HIGH (ref 0–99)
NonHDL: 182.7
Total CHOL/HDL Ratio: 4
Triglycerides: 141 mg/dL (ref 0.0–149.0)
VLDL: 28.2 mg/dL (ref 0.0–40.0)

## 2024-08-15 LAB — URINALYSIS
Bilirubin Urine: NEGATIVE
Hgb urine dipstick: NEGATIVE
Ketones, ur: NEGATIVE
Leukocytes,Ua: NEGATIVE
Nitrite: NEGATIVE
Specific Gravity, Urine: 1.015 (ref 1.000–1.030)
Total Protein, Urine: NEGATIVE
Urine Glucose: NEGATIVE
Urobilinogen, UA: 0.2 (ref 0.0–1.0)
pH: 7.5 (ref 5.0–8.0)

## 2024-08-15 LAB — COMPREHENSIVE METABOLIC PANEL WITH GFR
ALT: 13 U/L (ref 0–35)
AST: 21 U/L (ref 0–37)
Albumin: 4.4 g/dL (ref 3.5–5.2)
Alkaline Phosphatase: 70 U/L (ref 39–117)
BUN: 15 mg/dL (ref 6–23)
CO2: 30 meq/L (ref 19–32)
Calcium: 9.7 mg/dL (ref 8.4–10.5)
Chloride: 100 meq/L (ref 96–112)
Creatinine, Ser: 0.84 mg/dL (ref 0.40–1.20)
GFR: 69.39 mL/min (ref >60.00)
Glucose, Bld: 95 mg/dL (ref 70–99)
Potassium: 4.3 meq/L (ref 3.5–5.1)
Sodium: 137 meq/L (ref 135–145)
Total Bilirubin: 1.2 mg/dL (ref 0.2–1.2)
Total Protein: 7.1 g/dL (ref 6.0–8.3)

## 2024-08-15 LAB — CBC WITH DIFFERENTIAL/PLATELET
Basophils Absolute: 0.1 K/uL (ref 0.0–0.1)
Basophils Relative: 0.8 % (ref 0.0–3.0)
Eosinophils Absolute: 0.1 K/uL (ref 0.0–0.7)
Eosinophils Relative: 1.8 % (ref 0.0–5.0)
HCT: 42.6 % (ref 36.0–46.0)
Hemoglobin: 14.4 g/dL (ref 12.0–15.0)
Lymphocytes Relative: 35.8 % (ref 12.0–46.0)
Lymphs Abs: 2.9 K/uL (ref 0.7–4.0)
MCHC: 33.8 g/dL (ref 30.0–36.0)
MCV: 91.3 fl (ref 78.0–100.0)
Monocytes Absolute: 0.6 K/uL (ref 0.1–1.0)
Monocytes Relative: 6.7 % (ref 3.0–12.0)
Neutro Abs: 4.5 K/uL (ref 1.4–7.7)
Neutrophils Relative %: 54.9 % (ref 43.0–77.0)
Platelets: 273 K/uL (ref 150.0–400.0)
RBC: 4.66 Mil/uL (ref 3.87–5.11)
RDW: 13 % (ref 11.5–15.5)
WBC: 8.2 K/uL (ref 4.0–10.5)

## 2024-08-15 LAB — TSH: TSH: 2.29 u[IU]/mL (ref 0.35–5.50)

## 2024-08-17 ENCOUNTER — Ambulatory Visit: Payer: Self-pay | Admitting: Internal Medicine

## 2024-08-21 ENCOUNTER — Ambulatory Visit: Admitting: Internal Medicine

## 2024-08-21 ENCOUNTER — Encounter: Payer: Self-pay | Admitting: Internal Medicine

## 2024-08-21 VITALS — BP 142/68 | HR 64 | Temp 97.9°F | Ht 61.0 in | Wt 124.6 lb

## 2024-08-21 DIAGNOSIS — H539 Unspecified visual disturbance: Secondary | ICD-10-CM

## 2024-08-21 DIAGNOSIS — F419 Anxiety disorder, unspecified: Secondary | ICD-10-CM | POA: Diagnosis not present

## 2024-08-21 DIAGNOSIS — I480 Paroxysmal atrial fibrillation: Secondary | ICD-10-CM | POA: Diagnosis not present

## 2024-08-21 DIAGNOSIS — M818 Other osteoporosis without current pathological fracture: Secondary | ICD-10-CM | POA: Diagnosis not present

## 2024-08-21 DIAGNOSIS — E785 Hyperlipidemia, unspecified: Secondary | ICD-10-CM | POA: Diagnosis not present

## 2024-08-21 DIAGNOSIS — R944 Abnormal results of kidney function studies: Secondary | ICD-10-CM

## 2024-08-21 MED ORDER — METOPROLOL TARTRATE 25 MG PO TABS: 25.0000 mg | ORAL_TABLET | Freq: Two times a day (BID) | ORAL | 3 refills | Status: AC

## 2024-08-21 MED ORDER — ALPRAZOLAM 0.5 MG PO TABS: 0.5000 mg | ORAL_TABLET | Freq: Three times a day (TID) | ORAL | 1 refills | Status: AC | PRN

## 2024-08-21 NOTE — Assessment & Plan Note (Signed)
 Monitor GFR  Hydrate yourself well

## 2024-08-21 NOTE — Assessment & Plan Note (Signed)
Mild  F/u w/Dr Jacinto Halim

## 2024-08-21 NOTE — Assessment & Plan Note (Addendum)
 Pt declined anticoagulation Cont on Metoprolol 

## 2024-08-21 NOTE — Assessment & Plan Note (Signed)
 Xanax prn  Potential benefits of a long term benzodiazepines  use as well as potential risks  and complications were explained to the patient and were aknowledged.

## 2024-08-21 NOTE — Assessment & Plan Note (Signed)
 Chronic On Vit D, exercise Not interested in other prescription meds

## 2024-08-21 NOTE — Progress Notes (Signed)
 Subjective:  Patient ID: Amber Shelton, female    DOB: 04-09-1951  Age: 73 y.o. MRN: [id number]  CC: Medical Management of Chronic Issues (6 Month follow up)   HPI Amber Shelton presents for anxiety, A fib  Outpatient Medications Prior to Visit  Medication Sig Dispense Refill   APPLE CIDER VINEGAR PO Take 2 tablets by mouth daily.     Cholecalciferol (VITAMIN D3) 50 MCG (2000 UT) CHEW Chew 1 tablet by mouth daily.     diphenhydrAMINE  (BENADRYL ) 12.5 MG/5ML liquid Take 12.5 mg by mouth daily as needed.     ELDERBERRY PO Take 100 mg by mouth daily.     FIBER PO Take 5 g by mouth daily.     furosemide  (LASIX ) 20 MG tablet Take 1 tablet (20 mg total) by mouth daily as needed for edema or fluid. 30 tablet 1   loratadine  (CLARITIN ) 5 MG/5ML syrup Take 5 mg by mouth daily as needed for allergies or rhinitis.     Multiple Vitamins-Minerals (WOMENS MULTIVITAMIN PO) Take 2 tablets by mouth daily. Infusion vitamins     vitamin C (ASCORBIC ACID) 250 MG tablet Take 250 mg by mouth daily.     Zinc 30 MG CAPS Take 1 capsule by mouth daily.     ALPRAZolam  (XANAX ) 0.5 MG tablet Take 1 tablet (0.5 mg total) by mouth 3 (three) times daily as needed for anxiety. 90 tablet 1   metoprolol  tartrate (LOPRESSOR ) 25 MG tablet Take 1 tablet (25 mg total) by mouth 2 (two) times daily. 180 tablet 3   No facility-administered medications prior to visit.    ROS: Review of Systems  Constitutional:  Negative for activity change, appetite change, chills, fatigue and unexpected weight change.  HENT:  Negative for congestion, mouth sores and sinus pressure.   Eyes:  Positive for visual disturbance.  Respiratory:  Negative for cough and chest tightness.   Cardiovascular:  Negative for chest pain and palpitations.  Gastrointestinal:  Negative for abdominal pain and nausea.  Genitourinary:  Negative for difficulty urinating, frequency and vaginal pain.  Musculoskeletal:  Negative for back pain and gait problem.   Skin:  Negative for pallor and rash.  Neurological:  Negative for dizziness, tremors, weakness, numbness and headaches.  Psychiatric/Behavioral:  Negative for confusion, sleep disturbance and suicidal ideas. The patient is nervous/anxious.     Objective:  BP (!) 142/68   Pulse 64   Temp 97.9 F (36.6 C)   Ht 5' 1 (1.549 m)   Wt 124 lb 9.6 oz (56.5 kg)   SpO2 98%   BMI 23.54 kg/m   BP Readings from Last 3 Encounters:  08/21/24 (!) 142/68  02/18/24 110/68  08/03/23 118/74    Wt Readings from Last 3 Encounters:  08/21/24 124 lb 9.6 oz (56.5 kg)  02/18/24 122 lb (55.3 kg)  08/03/23 123 lb (55.8 kg)    Physical Exam Constitutional:      General: She is not in acute distress.    Appearance: Normal appearance. She is well-developed.  HENT:     Head: Normocephalic.     Right Ear: External ear normal.     Left Ear: External ear normal.     Nose: Nose normal.  Eyes:     General:        Right eye: No discharge.        Left eye: No discharge.     Conjunctiva/sclera: Conjunctivae normal.     Pupils: Pupils are equal, round, and  reactive to light.  Neck:     Thyroid : No thyromegaly.     Vascular: No JVD.     Trachea: No tracheal deviation.  Cardiovascular:     Rate and Rhythm: Normal rate and regular rhythm.     Heart sounds: Normal heart sounds.  Pulmonary:     Effort: No respiratory distress.     Breath sounds: No stridor. No wheezing.  Abdominal:     General: Bowel sounds are normal. There is no distension.     Palpations: Abdomen is soft. There is no mass.     Tenderness: There is no abdominal tenderness. There is no guarding or rebound.  Musculoskeletal:        General: No tenderness.     Cervical back: Normal range of motion and neck supple. No rigidity.  Lymphadenopathy:     Cervical: No cervical adenopathy.  Skin:    Findings: No erythema or rash.  Neurological:     Cranial Nerves: No cranial nerve deficit.     Motor: No abnormal muscle tone.      Coordination: Coordination normal.     Deep Tendon Reflexes: Reflexes normal.  Psychiatric:        Behavior: Behavior normal.        Thought Content: Thought content normal.        Judgment: Judgment normal.     Lab Results  Component Value Date   WBC 8.2 08/15/2024   HGB 14.4 08/15/2024   HCT 42.6 08/15/2024   PLT 273.0 08/15/2024   GLUCOSE 95 08/15/2024   CHOL 236 (H) 08/15/2024   TRIG 141.0 08/15/2024   HDL 53.20 08/15/2024   LDLDIRECT 157.0 07/15/2022   LDLCALC 155 (H) 08/15/2024   ALT 13 08/15/2024   AST 21 08/15/2024   NA 137 08/15/2024   K 4.3 08/15/2024   CL 100 08/15/2024   CREATININE 0.84 08/15/2024   BUN 15 08/15/2024   CO2 30 08/15/2024   TSH 2.29 08/15/2024   HGBA1C 5.4 12/10/2013    US  Renal Result Date: 07/26/2019 CLINICAL DATA:  Initial evaluation for decreased GFR. EXAM: RENAL / URINARY TRACT ULTRASOUND COMPLETE COMPARISON:  Prior ultrasound from 06/12/2014. FINDINGS: Right Kidney: Renal measurements: 9.9 x 3.2 x 3.4 cm = volume: 57.0 mL. Increased echogenicity within the renal parenchyma, suggesting medical renal disease. No nephrolithiasis or hydronephrosis. 1.0 x 0.9 x 1.1 cm minimally complex cyst present within the interpolar region, similar relative to 2015, most likely benign. Left Kidney: Renal measurements: 11.1 x 3.8 x 4.4 cm = volume: 96.6 mL. Increased echogenicity within the renal parenchyma, compatible with medical renal disease. No nephrolithiasis or hydronephrosis. No focal renal mass. Bladder: Appears normal for degree of bladder distention. Bilateral ureteral jets are visualized. Other: None. IMPRESSION: 1. Increased echogenicity within the renal parenchyma, compatible with medical renal disease. 2. No hydronephrosis. 3. 1.1 cm cyst within the interpolar right kidney, grossly stable from 2015, and most likely benign. Electronically Signed   By: Morene Hoard M.D.   On: 07/26/2019 21:52    Assessment & Plan:   Problem List Items  Addressed This Visit     Anxiety   Xanax  prn  Potential benefits of a long term benzodiazepines  use as well as potential risks  and complications were explained to the patient and were aknowledged.      Relevant Medications   ALPRAZolam  (XANAX ) 0.5 MG tablet   Decreased GFR   Monitor GFR  Hydrate yourself well      Dyslipidemia  Mild  F/u w/Dr Ladona      Osteoporosis   Chronic On Vit D, exercise Not interested in other prescription meds      PAF (paroxysmal atrial fibrillation) (HCC) (Chronic)   Pt declined anticoagulation Cont on Metoprolol       Relevant Medications   metoprolol  tartrate (LOPRESSOR ) 25 MG tablet   Other Visit Diagnoses       Vision changes    -  Primary   Relevant Orders   Ambulatory referral to Ophthalmology         Meds ordered this encounter  Medications   ALPRAZolam  (XANAX ) 0.5 MG tablet    Sig: Take 1 tablet (0.5 mg total) by mouth 3 (three) times daily as needed for anxiety.    Dispense:  90 tablet    Refill:  1   metoprolol  tartrate (LOPRESSOR ) 25 MG tablet    Sig: Take 1 tablet (25 mg total) by mouth 2 (two) times daily.    Dispense:  180 tablet    Refill:  3      Follow-up: Return in about 6 months (around 02/19/2025) for Wellness Exam.  Marolyn Noel, MD

## 2025-02-19 ENCOUNTER — Encounter: Admitting: Internal Medicine
